# Patient Record
Sex: Female | Born: 1970 | Race: White | Hispanic: No | Marital: Married | Smoking: Never smoker
Health system: Southern US, Community
[De-identification: ages and names within clinical notes are randomized; demographics above are authoritative.]

## PROBLEM LIST (undated history)

## (undated) DIAGNOSIS — T7840XA Allergy, unspecified, initial encounter: Secondary | ICD-10-CM

## (undated) DIAGNOSIS — D649 Anemia, unspecified: Secondary | ICD-10-CM

## (undated) DIAGNOSIS — E039 Hypothyroidism, unspecified: Secondary | ICD-10-CM

## (undated) DIAGNOSIS — K625 Hemorrhage of anus and rectum: Secondary | ICD-10-CM

## (undated) DIAGNOSIS — Z8619 Personal history of other infectious and parasitic diseases: Secondary | ICD-10-CM

## (undated) DIAGNOSIS — I10 Essential (primary) hypertension: Secondary | ICD-10-CM

## (undated) DIAGNOSIS — K635 Polyp of colon: Secondary | ICD-10-CM

## (undated) DIAGNOSIS — I499 Cardiac arrhythmia, unspecified: Secondary | ICD-10-CM

## (undated) DIAGNOSIS — D259 Leiomyoma of uterus, unspecified: Secondary | ICD-10-CM

## (undated) DIAGNOSIS — K649 Unspecified hemorrhoids: Secondary | ICD-10-CM

## (undated) DIAGNOSIS — J45909 Unspecified asthma, uncomplicated: Secondary | ICD-10-CM

## (undated) DIAGNOSIS — G43909 Migraine, unspecified, not intractable, without status migrainosus: Secondary | ICD-10-CM

## (undated) DIAGNOSIS — K219 Gastro-esophageal reflux disease without esophagitis: Secondary | ICD-10-CM

## (undated) DIAGNOSIS — Z87898 Personal history of other specified conditions: Secondary | ICD-10-CM

## (undated) DIAGNOSIS — A6000 Herpesviral infection of urogenital system, unspecified: Secondary | ICD-10-CM

## (undated) DIAGNOSIS — R7309 Other abnormal glucose: Secondary | ICD-10-CM

## (undated) DIAGNOSIS — J309 Allergic rhinitis, unspecified: Secondary | ICD-10-CM

## (undated) DIAGNOSIS — K602 Anal fissure, unspecified: Secondary | ICD-10-CM

## (undated) HISTORY — DX: Leiomyoma of uterus, unspecified: D25.9

## (undated) HISTORY — DX: Herpesviral infection of urogenital system, unspecified: A60.00

## (undated) HISTORY — PX: DENTAL SURGERY: SHX609

## (undated) HISTORY — DX: Personal history of other infectious and parasitic diseases: Z86.19

## (undated) HISTORY — DX: Hypothyroidism, unspecified: E03.9

## (undated) HISTORY — DX: Allergy, unspecified, initial encounter: T78.40XA

## (undated) HISTORY — DX: Migraine, unspecified, not intractable, without status migrainosus: G43.909

## (undated) HISTORY — DX: Unspecified hemorrhoids: K64.9

## (undated) HISTORY — DX: Gastro-esophageal reflux disease without esophagitis: K21.9

## (undated) HISTORY — DX: Hemorrhage of anus and rectum: K62.5

## (undated) HISTORY — DX: Other abnormal glucose: R73.09

## (undated) HISTORY — DX: Allergic rhinitis, unspecified: J30.9

## (undated) HISTORY — DX: Polyp of colon: K63.5

## (undated) HISTORY — DX: Unspecified asthma, uncomplicated: J45.909

## (undated) HISTORY — DX: Anal fissure, unspecified: K60.2

## (undated) HISTORY — PX: ABDOMINAL HYSTERECTOMY: SHX81

---

## 2010-08-02 DIAGNOSIS — K219 Gastro-esophageal reflux disease without esophagitis: Secondary | ICD-10-CM

## 2010-08-02 HISTORY — DX: Gastro-esophageal reflux disease without esophagitis: K21.9

## 2013-08-02 DIAGNOSIS — K635 Polyp of colon: Secondary | ICD-10-CM

## 2013-08-02 HISTORY — PX: COLONOSCOPY: SHX174

## 2013-08-02 HISTORY — DX: Polyp of colon: K63.5

## 2015-10-30 ENCOUNTER — Encounter: Payer: Self-pay | Admitting: Family Medicine

## 2015-10-30 DIAGNOSIS — A6000 Herpesviral infection of urogenital system, unspecified: Secondary | ICD-10-CM | POA: Insufficient documentation

## 2015-10-30 DIAGNOSIS — E039 Hypothyroidism, unspecified: Secondary | ICD-10-CM | POA: Insufficient documentation

## 2015-11-03 ENCOUNTER — Encounter: Payer: Self-pay | Admitting: Family Medicine

## 2015-11-04 ENCOUNTER — Encounter: Payer: Self-pay | Admitting: Family Medicine

## 2015-11-04 ENCOUNTER — Ambulatory Visit (INDEPENDENT_AMBULATORY_CARE_PROVIDER_SITE_OTHER): Payer: Managed Care, Other (non HMO) | Admitting: Family Medicine

## 2015-11-04 VITALS — BP 143/97 | HR 82 | Temp 97.9°F | Resp 20 | Ht 70.0 in | Wt 191.8 lb

## 2015-11-04 DIAGNOSIS — Z7689 Persons encountering health services in other specified circumstances: Secondary | ICD-10-CM

## 2015-11-04 DIAGNOSIS — Z0000 Encounter for general adult medical examination without abnormal findings: Secondary | ICD-10-CM | POA: Insufficient documentation

## 2015-11-04 DIAGNOSIS — E038 Other specified hypothyroidism: Secondary | ICD-10-CM

## 2015-11-04 DIAGNOSIS — R7309 Other abnormal glucose: Secondary | ICD-10-CM | POA: Insufficient documentation

## 2015-11-04 DIAGNOSIS — Z7189 Other specified counseling: Secondary | ICD-10-CM

## 2015-11-04 DIAGNOSIS — R030 Elevated blood-pressure reading, without diagnosis of hypertension: Secondary | ICD-10-CM | POA: Diagnosis not present

## 2015-11-04 DIAGNOSIS — Z86010 Personal history of colonic polyps: Secondary | ICD-10-CM | POA: Insufficient documentation

## 2015-11-04 DIAGNOSIS — E034 Atrophy of thyroid (acquired): Secondary | ICD-10-CM

## 2015-11-04 DIAGNOSIS — IMO0001 Reserved for inherently not codable concepts without codable children: Secondary | ICD-10-CM

## 2015-11-04 DIAGNOSIS — Z860101 Personal history of adenomatous and serrated colon polyps: Secondary | ICD-10-CM

## 2015-11-04 NOTE — Progress Notes (Signed)
Patient ID: Ann Cain, female   DOB: August 13, 1970, 45 y.o.   MRN: [id number]      Patient ID: Ann Cain, female  DOB: 04-25-1971, 45 y.o.   MRN: [id number]  Subjective:  Ann Cain is a 45 y.o. female present for establishment of care. All past medical history, surgical history, allergies, family history, immunizations, medications and social history were obtained and updated in the electronic medical record today. Records from OB/GYN and gastroenterologist from [place] have reviewed and scanned into the system. Prior lab work reviewed and entered into the system. Patient has a significant medical history of precancerous polyps of the colon, hypothyroidism, elevated A1c, and elevated blood pressure. She moved here from [place] little over a year ago with her family, and has been able to get her children established with the pediatrician. And she is established with a gynecologist but has non-established with a primary care provider or a gastroenterologist in the area.   Health maintenance:  Colonoscopy: 03/02/2014; adenoma, 3 year follow-up due August 2018. Mammogram: 10/2014; normal, has completed at gynecologist. Normal per patient. Cervical cancer screening: 08/02/2013, hysterectomy, [facility] Immunizations: Unknown, awaiting records from PCP in [place].  Infectious disease screening: Unknown  DEXA:Not indicated  Last eye exam 2015  Prior labs outside source: 11/01/2014: Cholesterol total 173, triglycerides 100, HDL 43, LDL 110, 10/09/2014: TSH 3.480, T4 free 1.4, glucose 94, BUN 13, creatinine 0.84, GFR 87, BUN/creatinine ratio 15, sodium 141, potassium 4.6, chloride 101, carbon dioxide 23, calcium 9.4, protein 7.2, albumin 4.6, platelet 0.5, alkaline phosphatase 53, AST 14, ALT 13, hemoglobin A1c 5.9   Past Medical History  Diagnosis Date  . Genital HSV   . Hemorrhoid   . Hypothyroidism   . Anal fissure   . Allergic rhinitis     [place] allergy/asthma patient    . Colon polyp 2015    tubular adenoma  . Rectal bleeding   . Acid reflux 2012    egd  . Allergy   . Asthma   . Migraines   . History of chicken pox   . Fibroid uterus    No Known Allergies Past Surgical History  Procedure Laterality Date  . Cesarean section  2002, 2004  . Colonoscopy  2015    Dr. Claudie Leach internal hemorrhoids, 2 polyps  . Dental surgery    . Abdominal hysterectomy     Family History  Problem Relation Age of Onset  . Kidney cancer Father 52  . Lung cancer Maternal Grandfather   . Colon cancer Paternal Grandmother 15  . Heart disease Paternal Grandfather    Social History   Social History  . Marital Status: Married    Spouse Name: N/A  . Number of Children: N/A  . Years of Education: N/A   Occupational History  . Not on file.   Social History Main Topics  . Smoking status: Never Smoker   . Smokeless tobacco: Never Used  . Alcohol Use: No  . Drug Use: No  . Sexual Activity: Yes    Birth Control/ Protection: None   Other Topics Concern  . Not on file   Social History Narrative   Married to Ann Cain. 3 children (one set of twins). 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 Ann Cain, Ann Cain.   College education. Masters degree, stay-at-home mother.   No tobacco use, no recreational drugs, occasional alcohol.   Drinks caffeinated beverages, takes a daily vitamin   Wears her seatbelt and bicycle helmet   Smoke detector in the home  Feels safe in her relationship.    ROS: Negative, with the exception of above mentioned in HPI  Objective: BP 143/97 mmHg  Pulse 82  Temp(Src) 97.9 F (36.6 C) (Oral)  Resp 20  Ht 5\' 10"  (1.778 m)  Wt 191 lb 12.8 oz (87 kg)  BMI 27.52 kg/m2  SpO2 98% Gen: Afebrile. No acute distress. Nontoxic in appearance, well-developed, well-nourished, female, [place]. HENT: AT. [place]. . MMM, no oral lesions Eyes:Pupils Equal Round Reactive to light, Extraocular movements intact,  Conjunctiva without redness, discharge or  icterus. Neck/lymp/endocrine: Supple, no lymphadenopathy CV: RRR no murmur appreciated, no edema. Chest: CTAB, no wheeze, rhonchi or crackles. Abd: Soft. NTND. BS present.  Skin: No rashes, purpura or petechiae. Warm and well-perfused. Skin intact. Neuro/Msk: Normal gait. PERLA. EOMi. Alert. Oriented x3.   Psych: Normal affect, dress and demeanor. Normal speech. Normal thought content and judgment.  Assessment/plan: Ann Cain is a 45 y.o. female present for establishment of care.  1. Elevated hemoglobin A1c - Records indicate collected March 2016 with a 5.9 result. - Repeat A1c on yearly physical which will be within 4 weeks. - Encouraged diet and exercise modifications.  2. H/O adenomatous polyp of colon - Prior records reviewed, and scanned into the system. Precancerous adenoma and a hyperplastic polyp removed 2015. - 3 year follow-up, she will need a referral for this for a gastroenterologist in the area.  3. Elevated BP - BP mildly elevated on exam today. Patient states that she feels like her blood pressure is always elevated on exam. She does not routinely check it at home. She is encouraged to check it at home over the next few weeks, if blood pressure remains above 140/90 she needs to be seen to discuss her blood pressure. Otherwise we'll follow-up with her blood pressure recheck in one month at her physical. Encouraged low-salt diet, and exercise.  4. Hypothyroidism due to acquired atrophy of thyroid Patient has had thyroiditis and she was 13. She is on levothyroxine 100 g, currently. Last TSH collected in March 2016 and normal.  5. Healthcare maintenance: awaiting records from PCP in [place].  Greater than 45 minutes was spent with patient, greater than 50% of that time was spent face-to-face with patient counseling and coordinating care.   4 weeks for yearly physical and labs ( CBC, CMP, lipid panel, TSH, A1c)  Electronically signed by: Howard Pouch, DO [place]

## 2015-11-04 NOTE — Patient Instructions (Signed)
Check your BP at home or at [facility] and write them done if above 140/90. Lower salt diet and exercise > 150 minutes a week.  Follow up in 4-6 weeks with physical and we will do labs at that time. We will do fasting labs (cholesterol as well).

## 2015-11-06 ENCOUNTER — Ambulatory Visit: Payer: Self-pay | Admitting: Family Medicine

## 2015-11-18 ENCOUNTER — Telehealth: Payer: Self-pay | Admitting: Family Medicine

## 2015-11-18 MED ORDER — AZITHROMYCIN 250 MG PO TABS: ORAL_TABLET | ORAL | Status: DC

## 2015-11-18 NOTE — Telephone Encounter (Signed)
Patient's children have been to pediatrician for whopping cough. There have been confirmed cases at [facility]. They will be going on a Zpac. The pediatrician recommended that patient go on a Zpac too as that she is also displaying they same symptoms as her children.

## 2015-11-18 NOTE — Telephone Encounter (Signed)
Pt was likely immunized against this illness during her last pregnancy.  - However I will call in a zpack. If symptoms worsen she will need to be seen. Cough can last >4 weeks with whooping cough. Patient does not have pharmacy listed, prescription printed and signed.

## 2015-11-19 NOTE — Telephone Encounter (Signed)
Spoke with patient she will come in to pick up script.

## 2015-11-21 ENCOUNTER — Encounter: Payer: Self-pay | Admitting: Family Medicine

## 2015-11-24 ENCOUNTER — Encounter: Payer: Self-pay | Admitting: Family Medicine

## 2015-12-08 ENCOUNTER — Ambulatory Visit (INDEPENDENT_AMBULATORY_CARE_PROVIDER_SITE_OTHER): Payer: Managed Care, Other (non HMO) | Admitting: Family Medicine

## 2015-12-08 ENCOUNTER — Encounter: Payer: Self-pay | Admitting: Family Medicine

## 2015-12-08 VITALS — BP 152/98 | HR 72 | Temp 98.9°F | Resp 20 | Ht 70.0 in | Wt 188.2 lb

## 2015-12-08 DIAGNOSIS — E038 Other specified hypothyroidism: Secondary | ICD-10-CM | POA: Diagnosis not present

## 2015-12-08 DIAGNOSIS — I10 Essential (primary) hypertension: Secondary | ICD-10-CM | POA: Diagnosis not present

## 2015-12-08 DIAGNOSIS — R7309 Other abnormal glucose: Secondary | ICD-10-CM | POA: Diagnosis not present

## 2015-12-08 DIAGNOSIS — E034 Atrophy of thyroid (acquired): Secondary | ICD-10-CM

## 2015-12-08 MED ORDER — HYDROCHLOROTHIAZIDE 25 MG PO TABS: 25.0000 mg | ORAL_TABLET | Freq: Every day | ORAL | Status: DC

## 2015-12-08 NOTE — Patient Instructions (Signed)
HCTZ 25  Mg start today nurse visit 1 week with fasting labs and then CPE to follow a few days later. Low-Sodium Eating Plan Sodium raises blood pressure and causes water to be held in the body. Getting less sodium from food will help lower your blood pressure, reduce any swelling, and protect your heart, liver, and kidneys. We get sodium by adding salt (sodium chloride) to food. Most of our sodium comes from canned, boxed, and frozen foods. Restaurant foods, fast foods, and pizza are also very high in sodium. Even if you take medicine to lower your blood pressure or to reduce fluid in your body, getting less sodium from your food is important. WHAT IS MY PLAN? Most people should limit their sodium intake to 2,300 mg a day. Your health care provider recommends that you limit your sodium intake to __________ a day.  WHAT DO I NEED TO KNOW ABOUT THIS EATING PLAN? For the low-sodium eating plan, you will follow these general guidelines:  Choose foods with a % Daily Value for sodium of less than 5% (as listed on the food label).   Use salt-free seasonings or herbs instead of table salt or sea salt.   Check with your health care provider or pharmacist before using salt substitutes.   Eat fresh foods.  Eat more vegetables and fruits.  Limit canned vegetables. If you do use them, rinse them well to decrease the sodium.   Limit cheese to 1 oz (28 g) per day.   Eat lower-sodium products, often labeled as "lower sodium" or "no salt added."  Avoid foods that contain monosodium glutamate (MSG). MSG is sometimes added to [place] food and some canned foods.  Check food labels (Nutrition Facts labels) on foods to learn how much sodium is in one serving.  Eat more home-cooked food and less restaurant, buffet, and fast food.  When eating at a restaurant, ask that your food be prepared with less salt, or no salt if possible.  HOW DO I READ FOOD LABELS FOR SODIUM INFORMATION? The Nutrition  Facts label lists the amount of sodium in one serving of the food. If you eat more than one serving, you must multiply the listed amount of sodium by the number of servings. Food labels may also identify foods as:  Sodium free--Less than 5 mg in a serving.  Very low sodium--35 mg or less in a serving.  Low sodium--140 mg or less in a serving.  Light in sodium--50% less sodium in a serving. For example, if a food that usually has 300 mg of sodium is changed to become light in sodium, it will have 150 mg of sodium.  Reduced sodium--25% less sodium in a serving. For example, if a food that usually has 400 mg of sodium is changed to reduced sodium, it will have 300 mg of sodium. WHAT FOODS CAN I EAT? Grains Low-sodium cereals, including oats, puffed wheat and rice, and shredded wheat cereals. Low-sodium crackers. Unsalted rice and pasta. Lower-sodium bread.  Vegetables Frozen or fresh vegetables. Low-sodium or reduced-sodium canned vegetables. Low-sodium or reduced-sodium tomato sauce and paste. Low-sodium or reduced-sodium tomato and vegetable juices.  Fruits Fresh, frozen, and canned fruit. Fruit juice.  Meat and Other Protein Products Low-sodium canned tuna and salmon. Fresh or frozen meat, poultry, seafood, and fish. Lamb. Unsalted nuts. Dried beans, peas, and lentils without added salt. Unsalted canned beans. Homemade soups without salt. Eggs.  Dairy Milk. Soy milk. Ricotta cheese. Low-sodium or reduced-sodium cheeses. Yogurt.  Condiments Fresh  and dried herbs and spices. Salt-free seasonings. Onion and garlic powders. Low-sodium varieties of mustard and ketchup. Fresh or refrigerated horseradish. Lemon juice.  Fats and Oils Reduced-sodium salad dressings. Unsalted butter.  Other Unsalted popcorn and pretzels.  The items listed above may not be a complete list of recommended foods or beverages. Contact your dietitian for more options. WHAT FOODS ARE NOT  RECOMMENDED? Grains Instant hot cereals. Bread stuffing, pancake, and biscuit mixes. Croutons. Seasoned rice or pasta mixes. Noodle soup cups. Boxed or frozen macaroni and cheese. Self-rising flour. Regular salted crackers. Vegetables Regular canned vegetables. Regular canned tomato sauce and paste. Regular tomato and vegetable juices. Frozen vegetables in sauces. Salted [place] fries. Olives. Angie Fava. Relishes. Sauerkraut. Salsa. Meat and Other Protein Products Salted, canned, smoked, spiced, or pickled meats, seafood, or fish. Bacon, ham, sausage, hot dogs, corned beef, chipped beef, and packaged luncheon meats. Salt pork. Jerky. Pickled herring. Anchovies, regular canned tuna, and sardines. Salted nuts. Dairy Processed cheese and cheese spreads. Cheese curds. Blue cheese and cottage cheese. Buttermilk.  Condiments Onion and garlic salt, seasoned salt, table salt, and sea salt. Canned and packaged gravies. Worcestershire sauce. Tartar sauce. Barbecue sauce. Teriyaki sauce. Soy sauce, including reduced sodium. Steak sauce. Fish sauce. Oyster sauce. Cocktail sauce. Horseradish that you find on the shelf. Regular ketchup and mustard. Meat flavorings and tenderizers. Bouillon cubes. Hot sauce. Tabasco sauce. Marinades. Taco seasonings. Relishes. Fats and Oils Regular salad dressings. Salted butter. Margarine. Ghee. Bacon fat.  Other Potato and tortilla chips. Corn chips and puffs. Salted popcorn and pretzels. Canned or dried soups. Pizza. Frozen entrees and pot pies.  The items listed above may not be a complete list of foods and beverages to avoid. Contact your dietitian for more information.   This information is not intended to replace advice given to you by your health care provider. Make sure you discuss any questions you have with your health care provider.   Document Released: 01/08/2002 Document Revised: 08/09/2014 Document Reviewed: 05/23/2013 Elsevier Interactive Patient Education  2016 [employer].   Hypertension Hypertension, commonly called high blood pressure, is when the force of blood pumping through your arteries is too strong. Your arteries are the blood vessels that carry blood from your heart throughout your body. A blood pressure reading consists of a higher number over a lower number, such as 110/72. The higher number (systolic) is the pressure inside your arteries when your heart pumps. The lower number (diastolic) is the pressure inside your arteries when your heart relaxes. Ideally you want your blood pressure below 120/80. Hypertension forces your heart to work harder to pump blood. Your arteries may become narrow or stiff. Having untreated or uncontrolled hypertension can cause heart attack, stroke, kidney disease, and other problems. RISK FACTORS Some risk factors for high blood pressure are controllable. Others are not.  Risk factors you cannot control include:   Race. You may be at higher risk if you are African American.  Age. Risk increases with age.  Gender. Men are at higher risk than women before age 38 years. After age 58, women are at higher risk than men. Risk factors you can control include:  Not getting enough exercise or physical activity.  Being overweight.  Getting too much fat, sugar, calories, or salt in your diet.  Drinking too much alcohol. SIGNS AND SYMPTOMS Hypertension does not usually cause signs or symptoms. Extremely high blood pressure (hypertensive crisis) may cause headache, anxiety, shortness of breath, and nosebleed. DIAGNOSIS To check if you  have hypertension, your health care provider will measure your blood pressure while you are seated, with your arm held at the level of your heart. It should be measured at least twice using the same arm. Certain conditions can cause a difference in blood pressure between your right and left arms. A blood pressure reading that is higher than normal on one occasion does not mean  that you need treatment. If it is not clear whether you have high blood pressure, you may be asked to return on a different day to have your blood pressure checked again. Or, you may be asked to monitor your blood pressure at home for 1 or more weeks. TREATMENT Treating high blood pressure includes making lifestyle changes and possibly taking medicine. Living a healthy lifestyle can help lower high blood pressure. You may need to change some of your habits. Lifestyle changes may include:  Following the DASH diet. This diet is high in fruits, vegetables, and whole grains. It is low in salt, red meat, and added sugars.  Keep your sodium intake below 2,300 mg per day.  Getting at least 30-45 minutes of aerobic exercise at least 4 times per week.  Losing weight if necessary.  Not smoking.  Limiting alcoholic beverages.  Learning ways to reduce stress. Your health care provider may prescribe medicine if lifestyle changes are not enough to get your blood pressure under control, and if one of the following is true:  You are 78-13 years of age and your systolic blood pressure is above 140.  You are 4 years of age or older, and your systolic blood pressure is above 150.  Your diastolic blood pressure is above 90.  You have diabetes, and your systolic blood pressure is over [id number] or your diastolic blood pressure is over 90.  You have kidney disease and your blood pressure is above 140/90.  You have heart disease and your blood pressure is above 140/90. Your personal target blood pressure may vary depending on your medical conditions, your age, and other factors. HOME CARE INSTRUCTIONS  Have your blood pressure rechecked as directed by your health care provider.   Take medicines only as directed by your health care provider. Follow the directions carefully. Blood pressure medicines must be taken as prescribed. The medicine does not work as well when you skip doses. Skipping doses also puts  you at risk for problems.  Do not smoke.   Monitor your blood pressure at home as directed by your health care provider. SEEK MEDICAL CARE IF:   You think you are having a reaction to medicines taken.  You have recurrent headaches or feel dizzy.  You have swelling in your ankles.  You have trouble with your vision. SEEK IMMEDIATE MEDICAL CARE IF:  You develop a severe headache or confusion.  You have unusual weakness, numbness, or feel faint.  You have severe chest or abdominal pain.  You vomit repeatedly.  You have trouble breathing. MAKE SURE YOU:   Understand these instructions.  Will watch your condition.  Will get help right away if you are not doing well or get worse.   This information is not intended to replace advice given to you by your health care provider. Make sure you discuss any questions you have with your health care provider.   Document Released: 07/19/2005 Document Revised: 12/03/2014 Document Reviewed: 05/11/2013 Elsevier Interactive Patient Education 2016 [place] your blood pressure at home, it should be below 140/90.

## 2015-12-08 NOTE — Progress Notes (Signed)
Patient ID: Ann Cain, female   DOB: Mar 23, 1971, 45 y.o.   MRN: [id number]    Ann Cain , 07-02-1971, 45 y.o., female MRN: [id number]  CC: Hypertension Subjective: Pt presents for an acute OV with high blood pressure readings. She was seen for establishment of few weeks ago with a elevated blood pressure at that time. She dates she feels like her blood pressure had always been borderline.  She does not feel that she consumes much salt in her diet, but she does not watch her diet closely. She denies any chest pain, shortness of breath or lower extremity edema. She does admit that she has swelling in her hands quite frequently.  She has a family history of heart disease, and states that she thinks one of her grandparents was on a hypertension medication. He does not routinely exercise.  No Known Allergies Social History  Substance Use Topics  . Smoking status: Never Smoker   . Smokeless tobacco: Never Used  . Alcohol Use: No   Past Medical History  Diagnosis Date  . Genital HSV   . Hemorrhoid   . Hypothyroidism   . Anal fissure   . Allergic rhinitis     [place] allergy/asthma patient  . Colon polyp 2015    tubular adenoma  . Rectal bleeding   . Acid reflux 2012    egd  . Allergy   . Asthma   . Migraines   . History of chicken pox   . Fibroid uterus    Past Surgical History  Procedure Laterality Date  . Cesarean section  2002, 2004  . Colonoscopy  2015    Dr. Claudie Leach internal hemorrhoids, 2 polyps  . Dental surgery    . Abdominal hysterectomy     Family History  Problem Relation Age of Onset  . Kidney cancer Father 85  . Lung cancer Maternal Grandfather   . Colon cancer Paternal Grandmother 10  . Heart disease Paternal Grandfather      Medication List       This list is accurate as of: 12/08/15  9:57 AM.  Always use your most recent med list.               fexofenadine 60 MG tablet  Commonly known as:  ALLEGRA  Take 60 mg by mouth 2 (two) times daily.     levothyroxine 100 MCG tablet  Commonly known as:  SYNTHROID, LEVOTHROID     NONFORMULARY OR COMPOUNDED ITEM  Diltiazem HCL 2%     PROAIR RESPICLICK 123XX123 (90 Base) MCG/ACT Aepb  Generic drug:  Albuterol Sulfate     tretinoin 0.025 % cream  Commonly known as:  RETIN-A  APPLY 1 APPLICATION A PEARL-SIZED AMOUNT TO FACE IN THE EVENING ONCE A DAY EXTERNALLY 30 DAYS     valACYclovir 500 MG tablet  Commonly known as:  VALTREX  TAKE 1 TABLET BY MOUTH DAILY AS PREVENTION, INCREASE TO TWICE A DAY FOR 3 DAYS WITH LESIONS       Social History   Social History Narrative   Married to Ann Cain. 3 children (one set of twins). 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 [name], [name].   College education. Masters degree, stay-at-home mother.   No tobacco use, no recreational drugs, occasional alcohol.   Drinks caffeinated beverages, takes a daily vitamin   Wears her seatbelt and bicycle helmet   Smoke detector in the home    Feels safe in her relationship.     ROS: Negative, with the exception of above  mentioned in HPI   Objective:  BP 141/102 mmHg  Pulse 72  Temp(Src) 98.9 F (37.2 C) (Oral)  Resp 20  Ht 5\' 10"  (1.778 m)  Wt 188 lb 4 oz (85.39 kg)  BMI 27.01 kg/m2  SpO2 96% Body mass index is 27.01 kg/(m^2). Gen: Afebrile. No acute distress. Nontoxic in appearance, well-developed, well-nourished, Caucasian female. HENT: AT. [place].  MMM, no oral lesions. Eyes:Pupils Equal Round Reactive to light, Extraocular movements intact,  Conjunctiva without redness, discharge or icterus. CV: RRR , no murmur appreciated. No lower extremity edema. Chest: CTAB, no wheeze or crackles. Good air movement, normal resp effort.  Neuro: Normal gait. PERLA. EOMi. Alert. Oriented x3  Psych: Normal affect, dress and demeanor. Normal speech. Normal thought content and judgment.  Assessment/Plan: Ann Cain is a 45 y.o. female present for acute OV for  1. Essential hypertension - New problem - Patient now with 2  elevated blood pressure readings during office visit, despite repeat testing. She did not follow up with testing in the outpatient setting. Discussed with her today we need to get her blood pressure under better control. She is to monitor the salt content in her diet, monitor her blood pressure in the outpatient setting. - Start HCTZ 25 mg daily today. Follow-up with the nurse visit in 1 week for blood pressure recheck. - hydrochlorothiazide (HYDRODIURIL) 25 MG tablet; Take 1 tablet (25 mg total) by mouth daily.  Dispense: 30 tablet; Refill: 0 - Future labs have been placed for her to have them drawn during her nurse visit next week, and then follow-up with her complete physical within a week after her lab draw.   electronically signed by:  Howard Pouch, DO  [place]

## 2015-12-15 ENCOUNTER — Other Ambulatory Visit: Payer: Managed Care, Other (non HMO)

## 2015-12-15 ENCOUNTER — Ambulatory Visit (INDEPENDENT_AMBULATORY_CARE_PROVIDER_SITE_OTHER): Payer: Managed Care, Other (non HMO) | Admitting: Family Medicine

## 2015-12-15 DIAGNOSIS — R7309 Other abnormal glucose: Secondary | ICD-10-CM | POA: Diagnosis not present

## 2015-12-15 DIAGNOSIS — E034 Atrophy of thyroid (acquired): Secondary | ICD-10-CM | POA: Diagnosis not present

## 2015-12-15 DIAGNOSIS — I10 Essential (primary) hypertension: Secondary | ICD-10-CM | POA: Diagnosis not present

## 2015-12-15 DIAGNOSIS — E038 Other specified hypothyroidism: Secondary | ICD-10-CM | POA: Diagnosis not present

## 2015-12-15 LAB — COMPREHENSIVE METABOLIC PANEL
ALK PHOS: 56 U/L (ref 39–117)
ALT: 29 U/L (ref 0–35)
AST: 17 U/L (ref 0–37)
Albumin: 4.9 g/dL (ref 3.5–5.2)
BUN: 17 mg/dL (ref 6–23)
CHLORIDE: 98 meq/L (ref 96–112)
CO2: 32 mEq/L (ref 19–32)
Calcium: 10.2 mg/dL (ref 8.4–10.5)
Creatinine, Ser: 0.96 mg/dL (ref 0.40–1.20)
GFR: 67.38 mL/min (ref >60.00)
GLUCOSE: 102 mg/dL — AB (ref 70–99)
POTASSIUM: 3.7 meq/L (ref 3.5–5.1)
SODIUM: 138 meq/L (ref 135–145)
Total Bilirubin: 0.9 mg/dL (ref 0.2–1.2)
Total Protein: 7.6 g/dL (ref 6.0–8.3)

## 2015-12-15 LAB — LIPID PANEL
Cholesterol: 231 mg/dL — ABNORMAL HIGH (ref 0–200)
HDL: 42.4 mg/dL (ref >39.00)
LDL Cholesterol: 163 mg/dL — ABNORMAL HIGH (ref 0–99)
NONHDL: 188.65
TRIGLYCERIDES: 126 mg/dL (ref 0.0–149.0)
Total CHOL/HDL Ratio: 5
VLDL: 25.2 mg/dL (ref 0.0–40.0)

## 2015-12-15 LAB — CBC WITH DIFFERENTIAL/PLATELET
BASOS PCT: 0.6 % (ref 0.0–3.0)
Basophils Absolute: 0 10*3/uL (ref 0.0–0.1)
Eosinophils Absolute: 0.1 10*3/uL (ref 0.0–0.7)
Eosinophils Relative: 2.1 % (ref 0.0–5.0)
HCT: 47.4 % — ABNORMAL HIGH (ref 36.0–46.0)
Hemoglobin: 15.9 g/dL — ABNORMAL HIGH (ref 12.0–15.0)
LYMPHS ABS: 1.2 10*3/uL (ref 0.7–4.0)
Lymphocytes Relative: 18.8 % (ref 12.0–46.0)
MCHC: 33.6 g/dL (ref 30.0–36.0)
MCV: 90.8 fl (ref 78.0–100.0)
MONO ABS: 0.8 10*3/uL (ref 0.1–1.0)
Monocytes Relative: 12.7 % — ABNORMAL HIGH (ref 3.0–12.0)
NEUTROS ABS: 4.1 10*3/uL (ref 1.4–7.7)
NEUTROS PCT: 65.8 % (ref 43.0–77.0)
Platelets: 240 10*3/uL (ref 150.0–400.0)
RBC: 5.22 Mil/uL — ABNORMAL HIGH (ref 3.87–5.11)
RDW: 13.1 % (ref 11.5–15.5)
WBC: 6.2 10*3/uL (ref 4.0–10.5)

## 2015-12-15 LAB — TSH: TSH: 1.75 u[IU]/mL (ref 0.35–4.50)

## 2015-12-15 LAB — HEMOGLOBIN A1C: HEMOGLOBIN A1C: 5.9 % (ref 4.6–6.5)

## 2015-12-15 NOTE — Progress Notes (Addendum)
Patient had appointment today for nurse visit only for BP check and lab draw.  Patient first BP check was elevated at 153/115.  I am going to let patient sit in a quiet room for 10 minutes and recheck BP and heart rate.   After sitting ten minutes, patients BP was still elevated at 149/113 with a resting pulse of 96.  Dr. Raoul Pitch advised.  Patient was given instructions to take HCTZ 25 MG BID per Dr Raoul Pitch new orders and to keep scheduled follow up in one week.  Patient agreed.  BP 153/115 mmHg  Pulse 98    Electronically Signed by: Howard Pouch, DO [facility] [place]

## 2015-12-15 NOTE — Addendum Note (Signed)
Addended by: Howard Pouch A on: 12/15/2015 05:12 PM   Modules accepted: Level of Service

## 2015-12-16 ENCOUNTER — Telehealth: Payer: Self-pay | Admitting: Family Medicine

## 2015-12-16 NOTE — Telephone Encounter (Signed)
Spoke with Dr Raoul Pitch patient needs to keep her appt as instructed at her appt yesterday. Left message with information on patient voice mail.

## 2015-12-16 NOTE — Telephone Encounter (Signed)
Patient states she is scheduled for complete physical on Monday, 5/22.  However, due to her current health issues she wants to know if she still needs to come in for her cpe as scheduled.  Patient wants pcp to know she thinks the symptoms she is experiencing is related to her gall bladder.  Patient requesting call back to advise what pcp recommends.

## 2015-12-17 ENCOUNTER — Encounter: Payer: Self-pay | Admitting: Family Medicine

## 2015-12-17 ENCOUNTER — Ambulatory Visit (INDEPENDENT_AMBULATORY_CARE_PROVIDER_SITE_OTHER): Payer: Managed Care, Other (non HMO) | Admitting: Family Medicine

## 2015-12-17 VITALS — BP 160/100 | HR 109 | Temp 98.3°F | Resp 20 | Wt 179.5 lb

## 2015-12-17 DIAGNOSIS — R1011 Right upper quadrant pain: Secondary | ICD-10-CM | POA: Diagnosis not present

## 2015-12-17 LAB — LIPASE: LIPASE: 37 U/L (ref 11.0–59.0)

## 2015-12-17 LAB — H. PYLORI ANTIBODY, IGG: H Pylori IgG: NEGATIVE

## 2015-12-17 MED ORDER — OMEPRAZOLE 40 MG PO CPDR: 40.0000 mg | DELAYED_RELEASE_CAPSULE | Freq: Every day | ORAL | Status: DC

## 2015-12-17 MED ORDER — ALIGN 4 MG PO CAPS: 1.0000 | ORAL_CAPSULE | Freq: Every day | ORAL | Status: DC

## 2015-12-17 NOTE — Progress Notes (Addendum)
Patient ID: Ann Cain, female   DOB: 1970/11/14, 45 y.o.   MRN: [id number]    Mikelle Myrick , 1970/12/01, 45 y.o., female MRN: [id number]  CC: abd pain  Subjective:  Abdominal pain: patient presents for abdominal pain that has worsened over the last 2 weeks after eating dinner out, at a restaurant with her husband. Her husband also ate the same meal, and is not ill. She states she ate filet, lobster and some type of custard. She does not usually eat foods like that and felt immediatly that evening noticed belching, gas, fever, chills and diarrhea x2 . She states the RUQ pain has been present off and on for many years, and has had prior US that was normal. She feels over the last week this is worse with nausea without vomit and decreased appetite. Patient states the symptoms are "difficult to explain" because so many things are "coming and going". She states symptoms are worse after attempts at meals. She feels the heartburn is also getting worse. She endorses multiple other compliant including nail changes, elevated BP despite start of therapy, palpitations, insomnia, dry  Mouth (after HCTZ start). Patient has a h/o GERD/gastritis by prior EGD a few years ago. She has not routinely taken PPI. Pt states she has noticed bowel changes in her entire family after moving to [place]. They do have well water. She has a h/o thyroid disorder, with NL panel this week. She denies increased anxiety and states her sisters both have anxiety and needs medications, but she does not want to take meds. She reports the yoga and tai chi classes were helping, but her husband did not "want her taking classes from a female instructor." She states with the insomnia she is just "unable to relax". While in the car driving she has to just train her self to take deep cleansing breaths.   GAD 7 : Generalized Anxiety Score 12/17/2015  Nervous, Anxious, on Edge 2  Control/stop worrying 0  Worry too much - different things 0  Trouble  relaxing 1  Restless 0  Easily annoyed or irritable 1  Afraid - awful might happen 1  Total GAD 7 Score 5     Depression screen PHQ 2/9 12/17/2015  Decreased Interest 0  Down, Depressed, Hopeless 0  PHQ - 2 Score 0    Mood disorder screen: Negative  No Known Allergies Social History  Substance Use Topics  . Smoking status: Never Smoker   . Smokeless tobacco: Never Used  . Alcohol Use: No   Past Medical History  Diagnosis Date  . Genital HSV   . Hemorrhoid   . Hypothyroidism   . Anal fissure   . Allergic rhinitis     [facility] allergy/asthma patient  . Colon polyp 2015    tubular adenoma  . Rectal bleeding   . Acid reflux 2012    egd  . Allergy   . Asthma   . Migraines   . History of chicken pox   . Fibroid uterus    Past Surgical History  Procedure Laterality Date  . Cesarean section  2002, 2004  . Colonoscopy  2015    Dr. Claudie Leach internal hemorrhoids, 2 polyps  . Dental surgery    . Abdominal hysterectomy     Family History  Problem Relation Age of Onset  . Kidney cancer Father 78  . Lung cancer Maternal Grandfather   . Colon cancer Paternal Grandmother 40  . Heart disease Paternal Grandmother   . Heart disease Paternal  Grandfather      Medication List       This list is accurate as of: 12/17/15 11:02 AM.  Always use your most recent med list.               fexofenadine 60 MG tablet  Commonly known as:  ALLEGRA  Take 60 mg by mouth 2 (two) times daily.     hydrochlorothiazide 25 MG tablet  Commonly known as:  HYDRODIURIL  Take 1 tablet (25 mg total) by mouth daily.     hydrocortisone 25 MG suppository  Commonly known as:  ANUSOL-HC  INSERT 1 (ONE) SUPPOSITORY, RECTAL, TWO TIMES DAILY     levothyroxine 100 MCG tablet  Commonly known as:  SYNTHROID, LEVOTHROID     NONFORMULARY OR COMPOUNDED ITEM  Diltiazem HCL 2%     PROAIR RESPICLICK 656 (90 Base) MCG/ACT Aepb  Generic drug:  Albuterol Sulfate     tretinoin 0.025 % cream  Commonly  known as:  RETIN-A  APPLY 1 APPLICATION A PEARL-SIZED AMOUNT TO FACE IN THE EVENING ONCE A DAY EXTERNALLY 30 DAYS     valACYclovir 500 MG tablet  Commonly known as:  VALTREX  TAKE 1 TABLET BY MOUTH DAILY AS PREVENTION, INCREASE TO TWICE A DAY FOR 3 DAYS WITH LESIONS       Social History   Social History  . Marital Status: Married    Spouse Name: N/A  . Number of Children: N/A  . Years of Education: N/A   Occupational History  . Not on file.   Social History Main Topics  . Smoking status: Never Smoker   . Smokeless tobacco: Never Used  . Alcohol Use: No  . Drug Use: No  . Sexual Activity: Yes    Birth Control/ Protection: None   Other Topics Concern  . Not on file   Social History Narrative   Married to [place]. 3 children (one set of twins). 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 [name], [name].   College education. Masters degree, stay-at-home mother.   No tobacco use, no recreational drugs, occasional alcohol.   Drinks caffeinated beverages, takes a daily vitamin   Wears her seatbelt and bicycle helmet   Smoke detector in the home    Feels safe in her relationship.    ROS: Negative, with the exception of above mentioned in HPI  Objective:  BP 160/100 mmHg  Pulse 109  Temp(Src) 98.3 F (36.8 C) (Oral)  Resp 20  Wt 179 lb 8 oz (81.421 kg)  SpO2 100% Body mass index is 25.76 kg/(m^2). Gen: Afebrile. Pacing the floor in the room. Well developed, well nourished, caucasian, female.  HENT: AT. [place].  MMM, no oral lesions.  Eyes:Pupils Equal Round Reactive to light, Extraocular movements intact,  Conjunctiva without redness, discharge or icterus. Neck/lymp/endocrine: Supple,No  lymphadenopathy, no  thyromegaly CV: tachycardic, regular rhythm. No murmur.   Chest: CTAB, no wheeze or crackles.  Abd: Soft. Flat . ND. RUQ and epigastric TTP. +murphy's sign. BS present. No Masses palpated. No rebound or guarding.  Skin: No rashes, purpura or petechiae.  Neuro: Normal gait. PERLA.  EOMi. Alert. Oriented x3  Psych: Normal affect, dress and demeanor. Normal speech/rambles. Normal thought content and judgment..   Assessment/Plan: Etter Royall is a 45 y.o. female present for acute OV for  Abdominal pain, right upper quadrant/epigatric: - H. pylori antibody, IgG - Lipase - US Abdomen Complete; Future - Probiotic Product (ALIGN) 4 MG CAPS; Take 1 capsule (4 mg total) by mouth daily.  Dispense: 30 capsule; Refill: 1 - omeprazole (PRILOSEC) 40 MG capsule; Take 1 capsule (40 mg total) by mouth daily.  Dispense: 30 capsule; Refill: 3 - have well water checked - GERD diet - F/U 4 weeks, or sooner if abnormal studies/imagining or symptoms worsen.    > 25 minutes spent with patient, >50% of time spent face to face counseling patient and coordinating care.  electronically signed by:  Howard Pouch, DO  [place]

## 2015-12-17 NOTE — Patient Instructions (Signed)
Food Choices for Gastroesophageal Reflux Disease, Adult When you have gastroesophageal reflux disease (GERD), the foods you eat and your eating habits are very important. Choosing the right foods can help ease the discomfort of GERD. WHAT GENERAL GUIDELINES DO I NEED TO FOLLOW?  Choose fruits, vegetables, whole grains, low-fat dairy products, and low-fat meat, fish, and poultry.  Limit fats such as oils, salad dressings, butter, nuts, and avocado.  Keep a food diary to identify foods that cause symptoms.  Avoid foods that cause reflux. These may be different for different people.  Eat frequent small meals instead of three large meals each day.  Eat your meals slowly, in a relaxed setting.  Limit fried foods.  Cook foods using methods other than frying.  Avoid drinking alcohol.  Avoid drinking large amounts of liquids with your meals.  Avoid bending over or lying down until 2-3 hours after eating. WHAT FOODS ARE NOT RECOMMENDED? The following are some foods and drinks that may worsen your symptoms: Vegetables Tomatoes. Tomato juice. Tomato and spaghetti sauce. Chili peppers. Onion and garlic. Horseradish. Fruits Oranges, grapefruit, and lemon (fruit and juice). Meats High-fat meats, fish, and poultry. This includes hot dogs, ribs, ham, sausage, salami, and bacon. Dairy Whole milk and chocolate milk. Sour cream. Cream. Butter. Ice cream. Cream cheese.  Beverages Coffee and tea, with or without caffeine. Carbonated beverages or energy drinks. Condiments Hot sauce. Barbecue sauce.  Sweets/Desserts Chocolate and cocoa. Donuts. Peppermint and spearmint. Fats and Oils High-fat foods, including [place] fries and potato chips. Other Vinegar. Strong spices, such as black pepper, white pepper, red pepper, cayenne, curry powder, cloves, ginger, and chili powder. The items listed above may not be a complete list of foods and beverages to avoid. Contact your dietitian for more  information.   This information is not intended to replace advice given to you by your health care provider. Make sure you discuss any questions you have with your health care provider.   Document Released: 07/19/2005 Document Revised: 08/09/2014 Document Reviewed: 05/23/2013 Elsevier Interactive Patient Education 2016 [employer].  Gastroesophageal Reflux Disease, Adult Normally, food travels down the esophagus and stays in the stomach to be digested. However, when a person has gastroesophageal reflux disease (GERD), food and stomach acid move back up into the esophagus. When this happens, the esophagus becomes sore and inflamed. Over time, GERD can create small holes (ulcers) in the lining of the esophagus.  CAUSES This condition is caused by a problem with the muscle between the esophagus and the stomach (lower esophageal sphincter, or LES). Normally, the LES muscle closes after food passes through the esophagus to the stomach. When the LES is weakened or abnormal, it does not close properly, and that allows food and stomach acid to go back up into the esophagus. The LES can be weakened by certain dietary substances, medicines, and medical conditions, including:  Tobacco use.  Pregnancy.  Having a hiatal hernia.  Heavy alcohol use.  Certain foods and beverages, such as coffee, chocolate, onions, and peppermint. RISK FACTORS This condition is more likely to develop in:  People who have an increased body weight.  People who have connective tissue disorders.  People who use NSAID medicines. SYMPTOMS Symptoms of this condition include:  Heartburn.  Difficult or painful swallowing.  The feeling of having a lump in the throat.  Abitter taste in the mouth.  Bad breath.  Having a large amount of saliva.  Having an upset or bloated stomach.  Belching.  Chest pain.  Shortness of breath or wheezing.  Ongoing (chronic) cough or a night-time cough.  Wearing away of  tooth enamel.  Weight loss. Different conditions can cause chest pain. Make sure to see your health care provider if you experience chest pain. DIAGNOSIS Your health care provider will take a medical history and perform a physical exam. To determine if you have mild or severe GERD, your health care provider may also monitor how you respond to treatment. You may also have other tests, including:  An endoscopy toexamine your stomach and esophagus with a small camera.  A test thatmeasures the acidity level in your esophagus.  A test thatmeasures how much pressure is on your esophagus.  A barium swallow or modified barium swallow to show the shape, size, and functioning of your esophagus. TREATMENT The goal of treatment is to help relieve your symptoms and to prevent complications. Treatment for this condition may vary depending on how severe your symptoms are. Your health care provider may recommend:  Changes to your diet.  Medicine.  Surgery. HOME CARE INSTRUCTIONS Diet  Follow a diet as recommended by your health care provider. This may involve avoiding foods and drinks such as:  Coffee and tea (with or without caffeine).  Drinks that containalcohol.  Energy drinks and sports drinks.  Carbonated drinks or sodas.  Chocolate and cocoa.  Peppermint and mint flavorings.  Garlic and onions.  Horseradish.  Spicy and acidic foods, including peppers, chili powder, curry powder, vinegar, hot sauces, and barbecue sauce.  Citrus fruit juices and citrus fruits, such as oranges, lemons, and limes.  Tomato-based foods, such as red sauce, chili, salsa, and pizza with red sauce.  Fried and fatty foods, such as donuts, french fries, potato chips, and high-fat dressings.  High-fat meats, such as hot dogs and fatty cuts of red and white meats, such as rib eye steak, sausage, ham, and bacon.  High-fat dairy items, such as whole milk, butter, and cream cheese.  Eat small,  frequent meals instead of large meals.  Avoid drinking large amounts of liquid with your meals.  Avoid eating meals during the 2-3 hours before bedtime.  Avoid lying down right after you eat.  Do not exercise right after you eat. General Instructions  Pay attention to any changes in your symptoms.  Take over-the-counter and prescription medicines only as told by your health care provider. Do not take aspirin, ibuprofen, or other NSAIDs unless your health care provider told you to do so.  Do not use any tobacco products, including cigarettes, chewing tobacco, and e-cigarettes. If you need help quitting, ask your health care provider.  Wear loose-fitting clothing. Do not wear anything tight around your waist that causes pressure on your abdomen.  Raise (elevate) the head of your bed 6 inches (15cm).  Try to reduce your stress, such as with yoga or meditation. If you need help reducing stress, ask your health care provider.  If you are overweight, reduce your weight to an amount that is healthy for you. Ask your health care provider for guidance about a safe weight loss goal.  Keep all follow-up visits as told by your health care provider. This is important. SEEK MEDICAL CARE IF:  You have new symptoms.  You have unexplained weight loss.  You have difficulty swallowing, or it hurts to swallow.  You have wheezing or a persistent cough.  Your symptoms do not improve with treatment.  You have a hoarse voice. SEEK IMMEDIATE MEDICAL CARE IF:  You have pain  in your arms, neck, jaw, teeth, or back.  You feel sweaty, dizzy, or light-headed.  You have chest pain or shortness of breath.  You vomit and your vomit looks like blood or coffee grounds.  You faint.  Your stool is bloody or black.  You cannot swallow, drink, or eat.   This information is not intended to replace advice given to you by your health care provider. Make sure you discuss any questions you have with  your health care provider.   Document Released: 04/28/2005 Document Revised: 04/09/2015 Document Reviewed: 11/13/2014 Elsevier Interactive Patient Education [employer].

## 2015-12-18 ENCOUNTER — Ambulatory Visit (INDEPENDENT_AMBULATORY_CARE_PROVIDER_SITE_OTHER): Payer: Managed Care, Other (non HMO)

## 2015-12-18 DIAGNOSIS — R1011 Right upper quadrant pain: Secondary | ICD-10-CM

## 2015-12-18 DIAGNOSIS — R932 Abnormal findings on diagnostic imaging of liver and biliary tract: Secondary | ICD-10-CM

## 2015-12-19 ENCOUNTER — Telehealth: Payer: Self-pay | Admitting: Family Medicine

## 2015-12-19 DIAGNOSIS — K828 Other specified diseases of gallbladder: Secondary | ICD-10-CM

## 2015-12-19 NOTE — Telephone Encounter (Signed)
Spoke with patient reviewed results and information regarding referral . Patient verbalized understanding.

## 2015-12-19 NOTE — Telephone Encounter (Signed)
Please call pt: - [place] abd was normal with the exception of "sludge" in the gallbladder. This could be causing her symptoms. This means her gallbladder is likely not functioning at full capacity. This is not emergent, but we should refer her to surgery to discuss/evaluate her options. I have placed this referral today. - her other labs are normal.

## 2015-12-22 ENCOUNTER — Ambulatory Visit (INDEPENDENT_AMBULATORY_CARE_PROVIDER_SITE_OTHER): Payer: Managed Care, Other (non HMO) | Admitting: Family Medicine

## 2015-12-22 ENCOUNTER — Encounter: Payer: Self-pay | Admitting: Family Medicine

## 2015-12-22 VITALS — BP 131/91 | HR 100 | Temp 97.9°F | Resp 20 | Ht 70.0 in | Wt 176.8 lb

## 2015-12-22 DIAGNOSIS — R002 Palpitations: Secondary | ICD-10-CM | POA: Insufficient documentation

## 2015-12-22 DIAGNOSIS — Z0000 Encounter for general adult medical examination without abnormal findings: Secondary | ICD-10-CM | POA: Diagnosis not present

## 2015-12-22 DIAGNOSIS — R7309 Other abnormal glucose: Secondary | ICD-10-CM

## 2015-12-22 DIAGNOSIS — Z23 Encounter for immunization: Secondary | ICD-10-CM | POA: Diagnosis not present

## 2015-12-22 DIAGNOSIS — I10 Essential (primary) hypertension: Secondary | ICD-10-CM

## 2015-12-22 MED ORDER — HYDROCHLOROTHIAZIDE 25 MG PO TABS: 25.0000 mg | ORAL_TABLET | Freq: Every day | ORAL | Status: DC

## 2015-12-22 NOTE — Patient Instructions (Signed)
Health Maintenance, Female Adopting a healthy lifestyle and getting preventive care can go a long way to promote health and wellness. Talk with your health care provider about what schedule of regular examinations is right for you. This is a good chance for you to check in with your provider about disease prevention and staying healthy. In between checkups, there are plenty of things you can do on your own. Experts have done a lot of research about which lifestyle changes and preventive measures are most likely to keep you healthy. Ask your health care provider for more information. WEIGHT AND DIET  Eat a healthy diet  Be sure to include plenty of vegetables, fruits, low-fat dairy products, and lean protein.  Do not eat a lot of foods high in solid fats, added sugars, or salt.  Get regular exercise. This is one of the most important things you can do for your health.  Most adults should exercise for at least 150 minutes each week. The exercise should increase your heart rate and make you sweat (moderate-intensity exercise).  Most adults should also do strengthening exercises at least twice a week. This is in addition to the moderate-intensity exercise.  Maintain a healthy weight  Body mass index (BMI) is a measurement that can be used to identify possible weight problems. It estimates body fat based on height and weight. Your health care provider can help determine your BMI and help you achieve or maintain a healthy weight.  For females 20 years of age and older:   A BMI below 18.5 is considered underweight.  A BMI of 18.5 to 24.9 is normal.  A BMI of 25 to 29.9 is considered overweight.  A BMI of 30 and above is considered obese.  Watch levels of cholesterol and blood lipids  You should start having your blood tested for lipids and cholesterol at 43 years of age, then have this test every 5 years.  You may need to have your cholesterol levels checked more often if:  Your lipid  or cholesterol levels are high.  You are older than 43 years of age.  You are at high risk for heart disease.  CANCER SCREENING   Lung Cancer  Lung cancer screening is recommended for adults 55-80 years old who are at high risk for lung cancer because of a history of smoking.  A yearly low-dose CT scan of the lungs is recommended for people who:  Currently smoke.  Have quit within the past 15 years.  Have at least a 30-pack-year history of smoking. A pack year is smoking an average of one pack of cigarettes a day for 1 year.  Yearly screening should continue until it has been 15 years since you quit.  Yearly screening should stop if you develop a health problem that would prevent you from having lung cancer treatment.  Breast Cancer  Practice breast self-awareness. This means understanding how your breasts normally appear and feel.  It also means doing regular breast self-exams. Let your health care provider know about any changes, no matter how small.  If you are in your 20s or 30s, you should have a clinical breast exam (CBE) by a health care provider every 1-3 years as part of a regular health exam.  If you are 40 or older, have a CBE every year. Also consider having a breast X-ray (mammogram) every year.  If you have a family history of breast cancer, talk to your health care provider about genetic screening.  If you   are at high risk for breast cancer, talk to your health care provider about having an MRI and a mammogram every year.  Breast cancer gene (BRCA) assessment is recommended for women who have family members with BRCA-related cancers. BRCA-related cancers include:  Breast.  Ovarian.  Tubal.  Peritoneal cancers.  Results of the assessment will determine the need for genetic counseling and BRCA1 and BRCA2 testing. Cervical Cancer Your health care provider may recommend that you be screened regularly for cancer of the pelvic organs (ovaries, uterus, and  vagina). This screening involves a pelvic examination, including checking for microscopic changes to the surface of your cervix (Pap test). You may be encouraged to have this screening done every 3 years, beginning at age 21.  For women ages 30-65, health care providers may recommend pelvic exams and Pap testing every 3 years, or they may recommend the Pap and pelvic exam, combined with testing for human papilloma virus (HPV), every 5 years. Some types of HPV increase your risk of cervical cancer. Testing for HPV may also be done on women of any age with unclear Pap test results.  Other health care providers may not recommend any screening for nonpregnant women who are considered low risk for pelvic cancer and who do not have symptoms. Ask your health care provider if a screening pelvic exam is right for you.  If you have had past treatment for cervical cancer or a condition that could lead to cancer, you need Pap tests and screening for cancer for at least 20 years after your treatment. If Pap tests have been discontinued, your risk factors (such as having a new sexual partner) need to be reassessed to determine if screening should resume. Some women have medical problems that increase the chance of getting cervical cancer. In these cases, your health care provider may recommend more frequent screening and Pap tests. Colorectal Cancer  This type of cancer can be detected and often prevented.  Routine colorectal cancer screening usually begins at 43 years of age and continues through 43 years of age.  Your health care provider may recommend screening at an earlier age if you have risk factors for colon cancer.  Your health care provider may also recommend using home test kits to check for hidden blood in the stool.  A small camera at the end of a tube can be used to examine your colon directly (sigmoidoscopy or colonoscopy). This is done to check for the earliest forms of colorectal  cancer.  Routine screening usually begins at age 50.  Direct examination of the colon should be repeated every 5-10 years through 43 years of age. However, you may need to be screened more often if early forms of precancerous polyps or small growths are found. Skin Cancer  Check your skin from head to toe regularly.  Tell your health care provider about any new moles or changes in moles, especially if there is a change in a mole's shape or color.  Also tell your health care provider if you have a mole that is larger than the size of a pencil eraser.  Always use sunscreen. Apply sunscreen liberally and repeatedly throughout the day.  Protect yourself by wearing long sleeves, pants, a wide-brimmed hat, and sunglasses whenever you are outside. HEART DISEASE, DIABETES, AND HIGH BLOOD PRESSURE   High blood pressure causes heart disease and increases the risk of stroke. High blood pressure is more likely to develop in:  People who have blood pressure in the high end   of the normal range (130-139/85-89 mm Hg).  People who are overweight or obese.  People who are African American.  If you are 38-23 years of age, have your blood pressure checked every 3-5 years. If you are 61 years of age or older, have your blood pressure checked every year. You should have your blood pressure measured twice--once when you are at a hospital or clinic, and once when you are not at a hospital or clinic. Record the average of the two measurements. To check your blood pressure when you are not at a hospital or clinic, you can use:  An automated blood pressure machine at a pharmacy.  A home blood pressure monitor.  If you are between 45 years and 39 years old, ask your health care provider if you should take aspirin to prevent strokes.  Have regular diabetes screenings. This involves taking a blood sample to check your fasting blood sugar level.  If you are at a normal weight and have a low risk for diabetes,  have this test once every three years after 43 years of age.  If you are overweight and have a high risk for diabetes, consider being tested at a younger age or more often. PREVENTING INFECTION  Hepatitis B  If you have a higher risk for hepatitis B, you should be screened for this virus. You are considered at high risk for hepatitis B if:  You were born in a country where hepatitis B is common. Ask your health care provider which countries are considered high risk.  Your parents were born in a high-risk country, and you have not been immunized against hepatitis B (hepatitis B vaccine).  You have HIV or AIDS.  You use needles to inject street drugs.  You live with someone who has hepatitis B.  You have had sex with someone who has hepatitis B.  You get hemodialysis treatment.  You take certain medicines for conditions, including cancer, organ transplantation, and autoimmune conditions. Hepatitis C  Blood testing is recommended for:  Everyone born from [birth year] through 1963.  Anyone with known risk factors for hepatitis C. Sexually transmitted infections (STIs)  You should be screened for sexually transmitted infections (STIs) including gonorrhea and chlamydia if:  You are sexually active and are younger than 43 years of age.  You are older than 43 years of age and your health care provider tells you that you are at risk for this type of infection.  Your sexual activity has changed since you were last screened and you are at an increased risk for chlamydia or gonorrhea. Ask your health care provider if you are at risk.  If you do not have HIV, but are at risk, it may be recommended that you take a prescription medicine daily to prevent HIV infection. This is called pre-exposure prophylaxis (PrEP). You are considered at risk if:  You are sexually active and do not regularly use condoms or know the HIV status of your partner(s).  You take drugs by injection.  You are sexually  active with a partner who has HIV. Talk with your health care provider about whether you are at high risk of being infected with HIV. If you choose to begin PrEP, you should first be tested for HIV. You should then be tested every 3 months for as long as you are taking PrEP.  PREGNANCY   If you are premenopausal and you may become pregnant, ask your health care provider about preconception counseling.  If you may  become pregnant, take 400 to 800 micrograms (mcg) of folic acid every day.  If you want to prevent pregnancy, talk to your health care provider about birth control (contraception). OSTEOPOROSIS AND MENOPAUSE   Osteoporosis is a disease in which the bones lose minerals and strength with aging. This can result in serious bone fractures. Your risk for osteoporosis can be identified using a bone density scan.  If you are 35 years of age or older, or if you are at risk for osteoporosis and fractures, ask your health care provider if you should be screened.  Ask your health care provider whether you should take a calcium or vitamin D supplement to lower your risk for osteoporosis.  Menopause may have certain physical symptoms and risks.  Hormone replacement therapy may reduce some of these symptoms and risks. Talk to your health care provider about whether hormone replacement therapy is right for you.  HOME CARE INSTRUCTIONS   Schedule regular health, dental, and eye exams.  Stay current with your immunizations.   Do not use any tobacco products including cigarettes, chewing tobacco, or electronic cigarettes.  If you are pregnant, do not drink alcohol.  If you are breastfeeding, limit how much and how often you drink alcohol.  Limit alcohol intake to no more than 1 drink per day for nonpregnant women. One drink equals 12 ounces of beer, 5 ounces of wine, or 1 ounces of hard liquor.  Do not use street drugs.  Do not share needles.  Ask your health care provider for help if  you need support or information about quitting drugs.  Tell your health care provider if you often feel depressed.  Tell your health care provider if you have ever been abused or do not feel safe at home.   This information is not intended to replace advice given to you by your health care provider. Make sure you discuss any questions you have with your health care provider.   Document Released: 02/01/2011 Document Revised: 08/09/2014 Document Reviewed: 06/20/2013 Elsevier Interactive Patient Education 2016 [place].  High Cholesterol High cholesterol refers to having a high level of cholesterol in your blood. Cholesterol is a white, waxy, fat-like protein that your body needs in small amounts. Your liver makes all the cholesterol you need. Excess cholesterol comes from the food you eat. Cholesterol travels in your bloodstream through your blood vessels. If you have high cholesterol, deposits (plaque) may build up on the walls of your blood vessels. This makes the arteries narrower and stiffer. Plaque increases your risk of heart attack and stroke. Work with your health care provider to keep your cholesterol levels in a healthy range. RISK FACTORS Several things can make you more likely to have high cholesterol. These include:   Eating foods high in animal fat (saturated fat) or cholesterol.  Being overweight.  Not getting enough exercise.  Having a family history of high cholesterol. SIGNS AND SYMPTOMS High cholesterol does not cause symptoms. DIAGNOSIS  Your health care provider can do a blood test to check whether you have high cholesterol. If you are older than 20, your health care provider may check your cholesterol every 4-6 years. You may be checked more often if you already have high cholesterol or other risk factors for heart disease. The blood test for cholesterol measures the following:  Bad cholesterol (LDL cholesterol). This is the type of cholesterol that causes heart  disease. This number should be less than 100.  Good cholesterol (HDL cholesterol). This type  helps protect against heart disease. A healthy level of HDL cholesterol is 60 or higher.  Total cholesterol. This is the combined number of LDL cholesterol and HDL cholesterol. A healthy number is less than 200. TREATMENT  High cholesterol can be treated with diet changes, lifestyle changes, and medicine.   Diet changes may include eating more whole grains, fruits, vegetables, nuts, and fish. You may also have to cut back on red meat and foods with a lot of added sugar.  Lifestyle changes may include getting at least 40 minutes of aerobic exercise three times a week. Aerobic exercises include walking, biking, and swimming. Aerobic exercise along with a healthy diet can help you maintain a healthy weight. Lifestyle changes may also include quitting smoking.  If diet and lifestyle changes are not enough to lower your cholesterol, your health care provider may prescribe a statin medicine. This medicine has been shown to lower cholesterol and also lower the risk of heart disease. HOME CARE INSTRUCTIONS  Only take over-the-counter or prescription medicines as directed by your health care provider.   Follow a healthy diet as directed by your health care provider. For instance:   Eat chicken (without skin), fish, veal, shellfish, ground [place] breast, and round or loin cuts of red meat.  Do not eat fried foods and fatty meats, such as hot dogs and salami.   Eat plenty of fruits, such as apples.   Eat plenty of vegetables, such as broccoli, potatoes, and carrots.   Eat beans, peas, and lentils.   Eat grains, such as barley, rice, couscous, and bulgur wheat.   Eat pasta without cream sauces.   Use skim or nonfat milk and low-fat or nonfat yogurt and cheeses. Do not eat or drink whole milk, cream, ice cream, egg yolks, and hard cheeses.   Do not eat stick margarine or tub margarines that  contain trans fats (also called partially hydrogenated oils).   Do not eat cakes, cookies, crackers, or other baked goods that contain trans fats.   Do not eat saturated tropical oils, such as coconut and palm oil.   Exercise as directed by your health care provider. Increase your activity level with activities such as gardening or walking.   Keep all follow-up appointments.  SEEK MEDICAL CARE IF:  You are struggling to maintain a healthy diet or weight.  You need help starting an exercise program.  You need help to stop smoking. SEEK IMMEDIATE MEDICAL CARE IF:  You have chest pain.  You have trouble breathing.   This information is not intended to replace advice given to you by your health care provider. Make sure you discuss any questions you have with your health care provider.   Document Released: 07/19/2005 Document Revised: 08/09/2014 Document Reviewed: 05/11/2013 Elsevier Interactive Patient Education 2016 [employer]   Prediabetes Eating Plan Prediabetes--also called impaired glucose tolerance or impaired fasting glucose--is a condition that causes blood sugar (blood glucose) levels to be higher than normal. Following a healthy diet can help to keep prediabetes under control. It can also help to lower the risk of type 2 diabetes and heart disease, which are increased in people who have prediabetes. Along with regular exercise, a healthy diet:  Promotes weight loss.  Helps to control blood sugar levels.  Helps to improve the way that the body uses insulin. WHAT DO I NEED TO KNOW ABOUT THIS EATING PLAN?  Use the glycemic index (GI) to plan your meals. The index tells you how quickly a food  will raise your blood sugar. Choose low-GI foods. These foods take a longer time to raise blood sugar.  Pay close attention to the amount of carbohydrates in the food that you eat. Carbohydrates increase blood sugar levels.  Keep track of how many calories you take in. Eating  the right amount of calories will help you to achieve a healthy weight. Losing about 7 percent of your starting weight can help to prevent type 2 diabetes.  You may want to follow a Mediterranean diet. This diet includes a lot of vegetables, lean meats or fish, whole grains, fruits, and healthy oils and fats. WHAT FOODS CAN I EAT? Grains Whole grains, such as whole-wheat or whole-grain breads, crackers, cereals, and pasta. Unsweetened oatmeal. Bulgur. Barley. Quinoa. Brown rice. Corn or whole-wheat flour tortillas or taco shells. Vegetables Lettuce. Spinach. Peas. Beets. Cauliflower. Cabbage. Broccoli. Carrots. Tomatoes. Squash. Eggplant. Herbs. Peppers. Onions. Cucumbers. Brussels sprouts. Fruits Berries. Bananas. Apples. Oranges. Grapes. Papaya. Mango. Pomegranate. Kiwi. Grapefruit. Cherries. Meats and Other Protein Sources Seafood. Lean meats, such as chicken and [place] or lean cuts of pork and beef. Tofu. Eggs. Nuts. Beans. Dairy Low-fat or fat-free dairy products, such as yogurt, cottage cheese, and cheese. Beverages Water. Tea. Coffee. Sugar-free or diet soda. Seltzer water. Milk. Milk alternatives, such as soy or almond milk. Condiments Mustard. Relish. Low-fat, low-sugar ketchup. Low-fat, low-sugar barbecue sauce. Low-fat or fat-free mayonnaise. Sweets and Desserts Sugar-free or low-fat pudding. Sugar-free or low-fat ice cream and other frozen treats. Fats and Oils Avocado. Walnuts. Olive oil. The items listed above may not be a complete list of recommended foods or beverages. Contact your dietitian for more options.  WHAT FOODS ARE NOT RECOMMENDED? Grains Refined white flour and flour products, such as bread, pasta, snack foods, and cereals. Beverages Sweetened drinks, such as sweet iced tea and soda. Sweets and Desserts Baked goods, such as cake, cupcakes, pastries, cookies, and cheesecake. The items listed above may not be a complete list of foods and beverages to avoid.  Contact your dietitian for more information.   This information is not intended to replace advice given to you by your health care provider. Make sure you discuss any questions you have with your health care provider.   Document Released: 12/03/2014 Document Reviewed: 12/03/2014 Elsevier Interactive Patient Education 2016 [place].   3 g of fish oil supplement daily, >150 minutes of exercise a week.  - Nutrition referral for prediabetes.

## 2015-12-22 NOTE — Progress Notes (Signed)
Patient ID: Ann Cain, female   DOB: 17-Dec-1970, 45 y.o.   MRN: [id number]      Patient ID: Ann Cain, female  DOB: [birth year], 45 y.o.   MRN: [id number]  Subjective:  Ann Cain is a 45 y.o. female present for annual well exam. All past medical history, surgical history, allergies, family history, immunizations, medications and social history were updated in the electronic medical record today. All recent labs, ED visits and hospitalizations within the last year were reviewed.  HTN- patient reports compliance with medications today. She is tolerating HCTZ BID. Blood pressure is closer to range, and pt states she is feeling better. She is monitoring the salt content in her diet. She could not exercise secondary to ABD discomfort, but feels she can start to get back into at least yoga.   Prediabetes: discussed elevated fasting glucose and a1c today. 5.9. Pt is in the prediabetes range. She is not routinely exercising yet, and FG 102. She has not monitored her diet for carbs, but does not feel she eats much sugar. She denies neuropathy or nonhealing wounds.   Hypercholesterolemia: discussed pts elevated lipid panel. She does have a FH- paternal grandparents of heart disease. She likes to to try "natural" regimens. Exercise/diet per above.    Palpitations: pt has endorsed palpitations 4-5 daily, mostly occuring after meals and at night. She states they sometimes wake her up between 2-4 am with a  heart pounding sensatoin. She reports episodes last about 5-10 minutes and self resolve. She is discontinued caffeine, with the exception of 1 cup of caffeine a day. She denies any chest pain, shortness of breath, dizziness or syncope.   Health maintenance:  Colonoscopy: 03/02/2014; adenoma, 3 year follow-up due August 2018. Mammogram: 10/2014; normal, has completed at gynecologist. Normal per patient. Cervical cancer screening: 08/02/2013, hysterectomy, GYN [facility] Immunizations: tdap  needed, pt uncertain last but likely with pregnancy >10 years ago.  Infectious disease screening: HIV with pregnancy 2014 DEXA:Not indicated  Last eye exam 2015 Assistive device: None Oxygen [id number] Patient has a Dental home. Hospitalizations/ED visits: None   Recent Results (from the past 2160 hour(s))  CBC w/Diff     Status: Abnormal   Collection Time: 12/15/15  9:04 AM  Result Value Ref Range   WBC 6.2 4.0 - 10.5 K/uL   RBC 5.22 (H) 3.87 - 5.11 Mil/uL   Hemoglobin 15.9 (H) 12.0 - 15.0 g/dL   HCT 47.4 (H) 36.0 - 46.0 %   MCV 90.8 78.0 - 100.0 fl   MCHC 33.6 30.0 - 36.0 g/dL   RDW 13.1 11.5 - 15.5 %   Platelets 240.0 150.0 - 400.0 K/uL   Neutrophils Relative % 65.8 43.0 - 77.0 %   Lymphocytes Relative 18.8 12.0 - 46.0 %   Monocytes Relative 12.7 (H) 3.0 - 12.0 %   Eosinophils Relative 2.1 0.0 - 5.0 %   Basophils Relative 0.6 0.0 - 3.0 %   Neutro Abs 4.1 1.4 - 7.7 K/uL   Lymphs Abs 1.2 0.7 - 4.0 K/uL   Monocytes Absolute 0.8 0.1 - 1.0 K/uL   Eosinophils Absolute 0.1 0.0 - 0.7 K/uL   Basophils Absolute 0.0 0.0 - 0.1 K/uL  Comprehensive metabolic panel     Status: Abnormal   Collection Time: 12/15/15  9:04 AM  Result Value Ref Range   Sodium 138 135 - 145 mEq/L   Potassium 3.7 3.5 - 5.1 mEq/L   Chloride 98 96 - 112 mEq/L   CO2 32 19 -  32 mEq/L   Glucose, Bld 102 (H) 70 - 99 mg/dL   BUN 17 6 - 23 mg/dL   Creatinine, Ser 0.96 0.40 - 1.20 mg/dL   Total Bilirubin 0.9 0.2 - 1.2 mg/dL   Alkaline Phosphatase 56 39 - 117 U/L   AST 17 0 - 37 U/L   ALT 29 0 - 35 U/L   Total Protein 7.6 6.0 - 8.3 g/dL   Albumin 4.9 3.5 - 5.2 g/dL   Calcium 10.2 8.4 - 10.5 mg/dL   GFR 67.38 >60.00 mL/min  HgB A1c     Status: None   Collection Time: 12/15/15  9:04 AM  Result Value Ref Range   Hgb A1c MFr Bld 5.9 4.6 - 6.5 %    Comment: Glycemic Control Guidelines for People with Diabetes:Non Diabetic:  <6%Goal of Therapy: <7%Additional Action Suggested:  >8%   Lipid panel     Status: Abnormal     Collection Time: 12/15/15  9:04 AM  Result Value Ref Range   Cholesterol 231 (H) 0 - 200 mg/dL    Comment: ATP III Classification       Desirable:  < 200 mg/dL               Borderline High:  200 - 239 mg/dL          High:  > = 240 mg/dL   Triglycerides 126.0 0.0 - 149.0 mg/dL    Comment: Normal:  <150 mg/dLBorderline High:  150 - 199 mg/dL   HDL 42.40 >39.00 mg/dL   VLDL 25.2 0.0 - 40.0 mg/dL   LDL Cholesterol 163 (H) 0 - 99 mg/dL   Total CHOL/HDL Ratio 5     Comment:                Men          Women1/2 Average Risk     3.4          3.3Average Risk          5.0          4.42X Average Risk          9.6          7.13X Average Risk          15.0          11.0                       NonHDL 188.65     Comment: NOTE:  Non-HDL goal should be 30 mg/dL higher than patient's LDL goal (i.e. LDL goal of < 70 mg/dL, would have non-HDL goal of < 100 mg/dL)  TSH     Status: None   Collection Time: 12/15/15  9:04 AM  Result Value Ref Range   TSH 1.75 0.35 - 4.50 uIU/mL  H. pylori antibody, IgG     Status: None   Collection Time: 12/15/15 11:43 AM  Result Value Ref Range   H Pylori IgG Negative Negative  Lipase     Status: None   Collection Time: 12/15/15 11:43 AM  Result Value Ref Range   Lipase 37.0 11.0 - 59.0 U/L     Past Medical History  Diagnosis Date  . Genital HSV   . Hemorrhoid   . Hypothyroidism   . Anal fissure   . Allergic rhinitis     [place] allergy/asthma patient  . Colon polyp 2015    tubular adenoma  . Rectal bleeding   .  Acid reflux 2012    egd  . Allergy   . Asthma   . Migraines   . History of chicken pox   . Fibroid uterus    No Known Allergies Past Surgical History  Procedure Laterality Date  . Cesarean section  2002, 2004  . Colonoscopy  2015    Dr. Claudie Leach internal hemorrhoids, 2 polyps  . Dental surgery    . Abdominal hysterectomy     Family History  Problem Relation Age of Onset  . Kidney cancer Father 6  . Lung cancer Maternal Grandfather   .  Colon cancer Paternal Grandmother 28  . Heart disease Paternal Grandmother   . Heart disease Paternal Grandfather    Social History   Social History  . Marital Status: Married    Spouse Name: N/A  . Number of Children: N/A  . Years of Education: N/A   Occupational History  . Not on file.   Social History Main Topics  . Smoking status: Never Smoker   . Smokeless tobacco: Never Used  . Alcohol Use: No  . Drug Use: No  . Sexual Activity: Yes    Birth Control/ Protection: None   Other Topics Concern  . Not on file   Social History Narrative   Married to [place]. 3 children (one set of twins). 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 [name], [name].   College education. Masters degree, stay-at-home mother.   No tobacco use, no recreational drugs, occasional alcohol.   Drinks caffeinated beverages, takes a daily vitamin   Wears her seatbelt and bicycle helmet   Smoke detector in the home    Feels safe in her relationship.   ROS: Negative, with the exception of above mentioned in HPI  Objective: BP 131/91 mmHg  Pulse 100  Temp(Src) 97.9 F (36.6 C) (Oral)  Resp 20  Ht 5\' 10"  (1.778 m)  Wt 176 lb 12 oz (80.173 kg)  BMI 25.36 kg/m2  SpO2 96% Gen: Afebrile. No acute distress. Nontoxic in appearance, well-developed, well-nourished, female, talkative   HENT: AT. [place]. Bilateral TM visualized and normal in appearance, normal external auditory canal. MMM, no oral lesions, Good dentition. Bilateral nares without erythema or swelling. Throat without erythema, ulcerations or exudates. No Cough on exam, No  hoarseness on exam. Eyes:Pupils Equal Round Reactive to light, Extraocular movements intact,  Conjunctiva without redness, discharge or icterus. Neck/lymp/endocrine: Supple,no lymphadenopathy, no thyromegaly CV: RRR, No edema, +2/4 P posterior tibialis pulses. Nocarotid bruits. No JVD. Chest: CTAB, no wheeze, rhonchi or crackles. Normal Respiratory effort. Good  Air movement. Abd: Soft.  flat. ND. Mild RUQ discomfort TTP. BS present. No Masses palpated. No hepatosplenomegaly. No rebound tenderness or guarding. Skin: No  rashes, purpura or petechiae. Warm and well-perfused. Skin intact. Neuro/Msk: Normal gait. PERLA. EOMi. Alert. Oriented x3.  Cranial nerves II through XII intact. Muscle strength 5/5 UE/LE extremity. DTRs equal bilaterally. Psych: Normal affect, dress and demeanor. Normal speech. Normal thought content and judgment.   Assessment/plan: Ann Cain is a 45 y.o. female present for annual exam.  Palpitations - Pt to remain off caffeine. No stimulant use.  - Could not obtain baseline EKG today secondary to system being down.  - Ambulatory referral to Cardiology for eval and treat.  Elevated hemoglobin A1c - discussed elevation, diet, exercise and 6 month  Follow up.  - Amb ref to Medical Nutrition Therapy-MNT  Need for diphtheria-tetanus-pertussis (Tdap) vaccine, adult/adolescent - Tdap vaccine greater than or equal to 7yo IM  Essential hypertension - better  control today.  - Continue HCTZ 25 mg BID - hydrochlorothiazide (HYDRODIURIL) 25 MG tablet; Take 1 tablet (25 mg total) by mouth daily.  Dispense: 60 tablet; Refill: 5 - pt to monitor at home if routinely above 140/90 will need to see sooner.  - 3 month f/u  Hyperlipidemia: - diet and exercise counseling. Pt wanting to try behavior modifications and fish oil supplement first. - 6 month follow up with repeat lipid/a1c, if not improved will start statin.   Encounter for preventive health examination. Patient was encouraged to exercise greater than 150 minutes a week. Patient was encouraged to choose a diet filled with fresh fruits and vegetables, and lean meats. AVS provided to patient today for education/recommendation on gender specific health and safety maintenance, prediabetes and hyperlipidemia.  Return in about 3 months (around 03/23/2016) for HTN.  Electronically signed by: Howard Pouch,  DO [place]

## 2016-01-01 HISTORY — PX: OTHER SURGICAL HISTORY: SHX169

## 2016-01-02 ENCOUNTER — Other Ambulatory Visit: Payer: Self-pay | Admitting: Surgery

## 2016-01-16 ENCOUNTER — Other Ambulatory Visit: Payer: Self-pay | Admitting: *Deleted

## 2016-01-16 DIAGNOSIS — I10 Essential (primary) hypertension: Secondary | ICD-10-CM

## 2016-01-16 MED ORDER — HYDROCHLOROTHIAZIDE 25 MG PO TABS: 25.0000 mg | ORAL_TABLET | Freq: Every day | ORAL | Status: DC

## 2016-01-16 NOTE — Telephone Encounter (Signed)
Patient request 90 day supply new Rx sent.

## 2016-01-20 ENCOUNTER — Encounter (INDEPENDENT_AMBULATORY_CARE_PROVIDER_SITE_OTHER): Payer: Managed Care, Other (non HMO)

## 2016-01-20 ENCOUNTER — Ambulatory Visit (INDEPENDENT_AMBULATORY_CARE_PROVIDER_SITE_OTHER): Payer: Managed Care, Other (non HMO) | Admitting: Cardiology

## 2016-01-20 ENCOUNTER — Encounter: Payer: Self-pay | Admitting: Cardiology

## 2016-01-20 VITALS — BP 139/100 | HR 91 | Ht 70.0 in | Wt 168.2 lb

## 2016-01-20 DIAGNOSIS — I10 Essential (primary) hypertension: Secondary | ICD-10-CM

## 2016-01-20 DIAGNOSIS — R002 Palpitations: Secondary | ICD-10-CM

## 2016-01-20 DIAGNOSIS — Z01810 Encounter for preprocedural cardiovascular examination: Secondary | ICD-10-CM | POA: Diagnosis not present

## 2016-01-20 DIAGNOSIS — R079 Chest pain, unspecified: Secondary | ICD-10-CM

## 2016-01-20 DIAGNOSIS — R011 Cardiac murmur, unspecified: Secondary | ICD-10-CM

## 2016-01-20 NOTE — Patient Instructions (Addendum)
Your physician has recommended that you wear an event monitor FOR 30 DAYS. Event monitors are medical devices that record the heart's electrical activity. Doctors most often [place] these monitors to diagnose arrhythmias. Arrhythmias are problems with the speed or rhythm of the heartbeat. The monitor is a small, portable device. You can wear one while you do your normal daily activities. This is usually used to diagnose what is causing palpitations/syncope (passing out).  WILL BE DONE AT [facility] Your physician has requested that you have an echocardiogram. Echocardiography is a painless test that uses sound waves to create images of your heart. It provides your doctor with information about the size and shape of your heart and how well your heart's chambers and valves are working. This procedure takes approximately one hour. There are no restrictions for this procedure.  WILL BE DONE AT [facility] OFFICE Your physician has requested that you have an exercise tolerance test. For further information please visit [email or web address]. Please also follow instruction sheet, as given.    NO CHANGE WITH CURRENT MEDICATIONS    Your physician recommends that you schedule a follow-up appointment in 2 MONTHS WITH DR HARDING.- FOLLOW UP MONITOR RESULTS.

## 2016-01-20 NOTE — Progress Notes (Signed)
PCP: Howard Pouch, DO  Clinic Note: Chief Complaint  Patient presents with  . New Patient (Initial Visit)    LIGHTHEADED;randomly when getting up from sitting down. cramping in legs.     HPI: Ann Cain is a 45 y.o. female with a PMH -- hypertension, elevated A1c level below who presents today for cardiology consultation to evaluate chest pain and palpitations associated with lightheadedness. ~ 5 yr ago ? Thyroid storm (Hashimoto's). She was also told that time she had a murmur.  Studies Reviewed: n/a  Interval History: Ann Cain presents here today with a about a month history of fluttering. Basically everything began in early May she had an episode of abdominal discomfort bloating and nausea with without emesis. This was then followed later on by episodes of palpitations or irregular heart beating. All these episodes of irregular piece last less than an hour maybe 15-30 minutes at a time. She says he describes as a fluttering sensation in her chest and that she actually feel the pounding. Denies sleeping chest pain symptom. She says she is having about 2-3 episodes a week, but it has actually been better lately. She doesn't have any dizziness or wooziness associated with it just feels anxious about it. No sick. Near-syncope. She thinks that all this began on May 6 when she started having a bunch of strange symptoms that she attributed being related to possible gallbladder issues. Similar symptoms occurred back 5 years ago when she was diagnosed with Hashimoto's thyroiditis. She's had fever and chill type symptoms as well as abdominal pain and nausea. She was told that she had an ultrasound showing sludge in the gallbladder and has a pending cholecystectomy.  About the second week of the onset of the symptoms she started having a little bit chest tightness and pain in both the chest and arm. She felt a little bit short of breath as well. This seemed to be postprandial. This symptoms that  has not recurred, but this shortness of breath has on occasion, especially with a rapid heartbeats. She denies any other episodes of chest tightness or pressure with rest or exertion. The symptoms are not made worse with activity.  She denies any PND, orthopnea or edema. Has not had any near syncope or syncope, TIA or murmurs fugax. She has had some nausea without vomiting and no epistaxis or hematemesis.  No claudication.  ROS: A comprehensive was performed. Review of Systems  Constitutional: Negative for fever and chills.       She did have F/C in early May  Respiratory: Negative for cough, shortness of breath and wheezing.   Cardiovascular: Positive for chest pain (not since late May -- post-prandial) and palpitations. Negative for orthopnea, claudication, leg swelling and PND.  Gastrointestinal: Positive for nausea (5/6 - no emesis.  Minimal since then), abdominal pain and blood in stool (seeing GI for ? anal dissue - using cream). Negative for melena.  Genitourinary: Negative for hematuria.  Musculoskeletal: Negative.        Better with change in diet  Neurological: Positive for tingling (hands) and headaches (@ night - getting better; intermittent Migraine HA). Negative for loss of consciousness.  Psychiatric/Behavioral: Negative for depression and memory loss. The patient is nervous/anxious (with all of her Sx). The patient does not have insomnia (had difficulty sleeping in May with HA & plapitations).   All other systems reviewed and are negative.   Past Medical History  Diagnosis Date  . Genital HSV   . Hemorrhoid   .  Hypothyroidism   . Anal fissure   . Allergic rhinitis     [place] allergy/asthma patient  . Colon polyp 2015    tubular adenoma  . Rectal bleeding   . Acid reflux 2012    egd  . Allergy   . Asthma   . Migraines   . History of chicken pox   . Fibroid uterus     Past Surgical History  Procedure Laterality Date  . Cesarean section  2002, 2004  .  Colonoscopy  2015    Dr. Claudie Leach internal hemorrhoids, 2 polyps  . Dental surgery    . Abdominal hysterectomy      Prior to Admission medications   Medication Sig Start Date End Date Taking? Authorizing Provider  Albuterol Sulfate (PROAIR RESPICLICK) [id number] (90 Base) MCG/ACT AEPB  06/06/15   Historical Provider, MD  fexofenadine (ALLEGRA) 60 MG tablet Take 60 mg by mouth 2 (two) times daily.    Historical Provider, MD  hydrochlorothiazide (HYDRODIURIL) 25 MG tablet Take 1 tablet (25 mg total) by mouth daily. 01/16/16   Renee A Kuneff, DO  hydrocortisone (ANUSOL-HC) 25 MG suppository INSERT 1 (ONE) SUPPOSITORY, RECTAL, TWO TIMES DAILY 12/02/15   Historical Provider, MD  levothyroxine (SYNTHROID, LEVOTHROID) 100 MCG tablet  05/14/15   Historical Provider, MD  NONFORMULARY OR COMPOUNDED ITEM Diltiazem HCL 2%    Historical Provider, MD  omeprazole (PRILOSEC) 40 MG capsule Take 1 capsule (40 mg total) by mouth daily. 12/17/15   Renee A Kuneff, DO  Probiotic Product (ALIGN) 4 MG CAPS Take 1 capsule (4 mg total) by mouth daily. 12/17/15   Renee A Kuneff, DO  tretinoin (RETIN-A) 0.025 % cream APPLY 1 APPLICATION A PEARL-SIZED AMOUNT TO FACE IN THE EVENING ONCE A DAY EXTERNALLY 30 DAYS 10/21/15   Historical Provider, MD  valACYclovir (VALTREX) 500 MG tablet TAKE 1 TABLET BY MOUTH DAILY AS PREVENTION, INCREASE TO TWICE A DAY FOR 3 DAYS WITH LESIONS 10/22/15   Historical Provider, MD    No Known Allergies   Social History   Social History  . Marital Status: Married    Spouse Name: N/A  . Number of Children: N/A  . Years of Education: N/A   Social History Main Topics  . Smoking status: Current Every Day Smoker -- 2.00 packs/day  . Smokeless tobacco: Never Used  . Alcohol Use: No  . Drug Use: No  . Sexual Activity: Yes    Birth Control/ Protection: None   Other Topics Concern  . None   Social History Narrative   Married to [place]. 3 children (one set of twins). 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 Ann Cain,  Ann Cain.   College education. Masters degree, stay-at-home mother.   No tobacco use, no recreational drugs, occasional alcohol.   Drinks caffeinated beverages, takes a daily vitamin   Wears her seatbelt and bicycle helmet   Smoke detector in the home    Feels safe in her relationship.    Family History  Problem Relation Age of Onset  . Kidney cancer Father 53  . Lung cancer Maternal Grandfather   . Colon cancer Paternal Grandmother 40  . Heart disease Paternal Grandmother   . Heart disease Paternal Grandfather     Wt Readings from Last 3 Encounters:  01/20/16 168 lb 3.2 oz (76.295 kg)  12/22/15 176 lb 12 oz (80.173 kg)  12/17/15 179 lb 8 oz (81.421 kg)    PHYSICAL EXAM BP 139/100 mmHg  Pulse 91  Ht 5\' 10"  (1.778 m)  Wt  168 lb 3.2 oz (76.295 kg)  BMI 24.13 kg/m2 General appearance: alert, cooperative, appears stated age, no distress and Well-nourished, well-groomed. Healthy-appearing. Somewhat anxious however. Normal mood and affect. Neck: no adenopathy, no carotid bruit and no JVD Lungs: clear to auscultation bilaterally, normal percussion bilaterally and non-labored Heart: regular rate and rhythm, S1 & S2 normal, no obvious murmur (however cannot exclude soft SEM) , click, rub or gallop; nondisplaced PMI Abdomen: soft, non-tender; bowel sounds normal; no masses,  no organomegaly; no HJR  Extremities: extremities normal, atraumatic, no cyanosis,ora  Pulses: 2+ and symmetric;  Skin: mobility and turgor normal, no edema, no evidence of bleeding or bruising and no lesions noted  Neurologic: Mental status: Alert, oriented, thought content appropriate Cranial nerves: normal (II-XII grossly intact)    Adult ECG Report  Rate: 91 ;  Rhythm: normal sinus rhythm and Left axis deviation (-38). Incomplete RBBB, nonspecific T-wave abnormalities. Borderline QTC (499).;   Narrative Interpretation: Mildly abnormal EKG, but no acute findings.  Other studies Reviewed: Additional  studies/ records that were reviewed today include:  Recent Labs:  PCP - concerned re: HTN, HLD & high CBG (Metabolic Syndrome) - now on diet, plan recheck in 42month   Lab Results  Component Value Date   CHOL 231* 12/15/2015   HDL 42.40 12/15/2015   LDLCALC 163* 12/15/2015   TRIG 126.0 12/15/2015   CHOLHDL 5 12/15/2015   Lab Results  Component Value Date   TSH 1.75 12/15/2015   Lab Results  Component Value Date   HGB 15.9* 12/15/2015    ASSESSMENT / PLAN: Problem List Items Addressed This Visit    Pre-operative cardiovascular examination    She is due to have a cholecystectomy, but with her cardiac complaints is likely requiring cardiovascular risk assessment.  Until we know the etiology of her symptoms, cannot give a true evaluation. However in that regard, we are proceeding with a GXT for ischemic evaluation, event monitor for arrhythmia detection as well as an echocardiogram for functionality evaluation.      Relevant Orders   EKG 12-Lead   Cardiac event monitor   EXERCISE TOLERANCE TEST   ECHOCARDIOGRAM COMPLETE (Completed)   Palpitations - Primary    Not really clear what sounds like she may be having brief runs of PSVT or simply just bigeminy type pattern. Probably triggered by her anxiety from underlying illness. Sounds like she's having the episodes a week, but desperately now. I think we'll have to use a formal monitor the emergency room exactly is happening. Thankfully, she is not symptomatic syncope or near syncope standpoint.   Plan: 30 day event monitor. Also check 2-D echocardiogram to exclude structural abnormality.        Relevant Orders   EKG 12-Lead   Cardiac event monitor   EXERCISE TOLERANCE TEST   ECHOCARDIOGRAM COMPLETE (Completed)   Pain in the chest    The chest discomfort she described probably is more related to her gallbladder issue, however with no active cardiac complaints and preoperative assessment pending, is not unreasonable to do an  ischemic assessment.  Her EKG is a little abnormal baseline, but should be accurately interpreted with stress test. Plan: GXT- (if for whatever reason, unable to get adequate heart rate or interpretable results, and probably proceed stress echocardiogram.      Relevant Orders   EKG 12-Lead   Cardiac event monitor   EXERCISE TOLERANCE TEST   ECHOCARDIOGRAM COMPLETE (Completed)   Murmur    I did not hear murmur exam,  I suspect that the murmur heard several years ago was a flow murmur. However in the interest of completeness with chest pain and palpitations, will check a 2-D echocardiogram as part of a preoperative evaluation.      Relevant Orders   EKG 12-Lead   Cardiac event monitor   EXERCISE TOLERANCE TEST   ECHOCARDIOGRAM COMPLETE (Completed)   Essential hypertension (Chronic)    Blood pressure is borderline here today on HCTZ alone. We should have room for either beta blocker or calcium channel blocker to treat her symptoms if necessary.      Relevant Orders   EKG 12-Lead   Cardiac event monitor   EXERCISE TOLERANCE TEST   ECHOCARDIOGRAM COMPLETE (Completed)     Current medicines are reviewed at length with the patient today. (+/- concerns) none The following changes have been made: none  Studies Ordered:   Orders Placed This Encounter  Procedures  . Cardiac event monitor  . EXERCISE TOLERANCE TEST  . EKG 12-Lead  . ECHOCARDIOGRAM COMPLETE      Glenetta Hew, M.D., M.S. Interventional Cardiologist   Pager # [phone] Phone # [phone] [address]. [place], [place] [address]

## 2016-01-21 ENCOUNTER — Other Ambulatory Visit: Payer: Self-pay

## 2016-01-21 ENCOUNTER — Ambulatory Visit (HOSPITAL_COMMUNITY): Payer: Managed Care, Other (non HMO) | Attending: Cardiovascular Disease

## 2016-01-21 ENCOUNTER — Encounter: Payer: Self-pay | Admitting: Cardiology

## 2016-01-21 DIAGNOSIS — R079 Chest pain, unspecified: Secondary | ICD-10-CM

## 2016-01-21 DIAGNOSIS — I071 Rheumatic tricuspid insufficiency: Secondary | ICD-10-CM | POA: Diagnosis not present

## 2016-01-21 DIAGNOSIS — R011 Cardiac murmur, unspecified: Secondary | ICD-10-CM

## 2016-01-21 DIAGNOSIS — I10 Essential (primary) hypertension: Secondary | ICD-10-CM

## 2016-01-21 DIAGNOSIS — Z01810 Encounter for preprocedural cardiovascular examination: Secondary | ICD-10-CM

## 2016-01-21 DIAGNOSIS — R002 Palpitations: Secondary | ICD-10-CM | POA: Diagnosis not present

## 2016-01-21 DIAGNOSIS — R9431 Abnormal electrocardiogram [ECG] [EKG]: Secondary | ICD-10-CM | POA: Diagnosis present

## 2016-01-21 HISTORY — PX: TRANSTHORACIC ECHOCARDIOGRAM: SHX275

## 2016-01-21 LAB — ECHOCARDIOGRAM COMPLETE
CHL CUP MV DEC (S): 201
E decel time: 201 msec
E/e' ratio: 4.6
FS: 30 % (ref 28–44)
IVS/LV PW RATIO, ED: 1.05
LA diam end sys: 21 mm
LA vol A4C: 24 ml
LADIAMINDEX: 1.08 cm/m2
LASIZE: 21 mm
LV E/e' medial: 4.6
LV E/e'average: 4.6
LVELAT: 14.9 cm/s
LVOT SV: 69 mL
LVOT VTI: 22 cm
LVOT area: 3.14 cm2
LVOT diameter: 20 mm
LVOTPV: 111 cm/s
MV pk A vel: 60.7 m/s
MVPKEVEL: 68.6 m/s
PW: 9.11 mm — AB (ref 0.6–1.1)
Reg peak vel: 189 cm/s
TDI e' lateral: 14.9
TDI e' medial: 8.66
TR max vel: 189 cm/s

## 2016-01-21 NOTE — Assessment & Plan Note (Signed)
Not really clear what sounds like she may be having brief runs of PSVT or simply just bigeminy type pattern. Probably triggered by her anxiety from underlying illness. Sounds like she's having the episodes a week, but desperately now. I think we'll have to use a formal monitor the emergency room exactly is happening. Thankfully, she is not symptomatic syncope or near syncope standpoint.   Plan: 30 day event monitor. Also check 2-D echocardiogram to exclude structural abnormality.

## 2016-01-21 NOTE — Assessment & Plan Note (Signed)
Blood pressure is borderline here today on HCTZ alone. We should have room for either beta blocker or calcium channel blocker to treat her symptoms if necessary.

## 2016-01-21 NOTE — Assessment & Plan Note (Signed)
She is due to have a cholecystectomy, but with her cardiac complaints is likely requiring cardiovascular risk assessment.  Until we know the etiology of her symptoms, cannot give a true evaluation. However in that regard, we are proceeding with a GXT for ischemic evaluation, event monitor for arrhythmia detection as well as an echocardiogram for functionality evaluation.

## 2016-01-21 NOTE — Assessment & Plan Note (Signed)
I did not hear murmur exam, I suspect that the murmur heard several years ago was a flow murmur. However in the interest of completeness with chest pain and palpitations, will check a 2-D echocardiogram as part of a preoperative evaluation.

## 2016-01-21 NOTE — Progress Notes (Signed)
Quick Note:  Echo results: Good news: Essentially normal echocardiogram and normal pump function (EF: 55-60%) .and normal valve function. No regional wall motion abnormalities = No signs to suggest heart attack..  There does not appear to be any structural abnormalities to explain palpitations. No mitral valve prolapse.  Glenetta Hew, MD      ______

## 2016-01-21 NOTE — Assessment & Plan Note (Signed)
The chest discomfort she described probably is more related to her gallbladder issue, however with no active cardiac complaints and preoperative assessment pending, is not unreasonable to do an ischemic assessment.  Her EKG is a little abnormal baseline, but should be accurately interpreted with stress test. Plan: GXT- (if for whatever reason, unable to get adequate heart rate or interpretable results, and probably proceed stress echocardiogram.

## 2016-01-23 ENCOUNTER — Ambulatory Visit (HOSPITAL_COMMUNITY)
Admission: RE | Admit: 2016-01-23 | Discharge: 2016-01-23 | Disposition: A | Discharge status: Home / Self Care | Payer: Managed Care, Other (non HMO) | Source: Ambulatory Visit | Attending: Cardiology | Admitting: Cardiology

## 2016-01-23 ENCOUNTER — Telehealth: Payer: Self-pay | Admitting: *Deleted

## 2016-01-23 DIAGNOSIS — Z01810 Encounter for preprocedural cardiovascular examination: Secondary | ICD-10-CM

## 2016-01-23 DIAGNOSIS — I10 Essential (primary) hypertension: Secondary | ICD-10-CM

## 2016-01-23 DIAGNOSIS — R079 Chest pain, unspecified: Secondary | ICD-10-CM | POA: Diagnosis not present

## 2016-01-23 DIAGNOSIS — R002 Palpitations: Secondary | ICD-10-CM | POA: Diagnosis not present

## 2016-01-23 DIAGNOSIS — R011 Cardiac murmur, unspecified: Secondary | ICD-10-CM

## 2016-01-23 LAB — EXERCISE TOLERANCE TEST
CHL CUP RESTING HR STRESS: 121 {beats}/min
CSEPED: 8 min
CSEPEDS: 0 s
Estimated workload: 10.1 METS
MPHR: 177 {beats}/min
Peak HR: 176 {beats}/min
Percent HR: 99 %
RPE: 15

## 2016-01-23 NOTE — Telephone Encounter (Signed)
-----   Message from [place], [place] sent at 01/23/2016 12:45 PM EDT ----- Regarding: ETT result Patient underwent outpatient ETT today for preop clearance for possible lap chole. I have discussed with Dr. Meda Coffee regarding ETT EKG, her EKG at rest shows incomplete RBBB, flattening of T wave and minimal diffuse ST depression. But the more she exercise, the better her EKG looks. She has no chest pain or any symptom at all with exercise. After reaching target HR during 2nd stage of Bruce protocol, I was able to push her into the 3rd stage without problem. She reached 10.10 METS  I reviewed the EKG with Dr. Meda Coffee, given recent normal Echo, she should be cleared for surgery by Dr. Coralie Keens.   Just FYI. Anderson Malta can you inform the patient?  Hilbert Corrigan PA Pager: [phone]

## 2016-01-23 NOTE — Telephone Encounter (Signed)
Called pt re: message below.Marland Kitchenlmptcb jw 6/23/7

## 2016-01-27 HISTORY — PX: OTHER SURGICAL HISTORY: SHX169

## 2016-01-28 NOTE — Telephone Encounter (Signed)
Spoke with pt.  She has been made aware that she has been cleared for lap chole s

## 2016-01-28 NOTE — Telephone Encounter (Signed)
Spoke with pt and she has been made aware that she has been cleared for lap chole sx and Dr. Lenise Arena nurse has been notified, Pt verbalized appreciation for the call.

## 2016-01-30 ENCOUNTER — Telehealth: Payer: Self-pay | Admitting: *Deleted

## 2016-01-30 NOTE — Telephone Encounter (Signed)
-----   Message from Leonie Man, MD sent at 01/24/2016 12:04 AM EDT ----- Colletta [place]: I reviewed stress test. I am enclosing a phone message from my my PAs who ran your stress test and reviewed with the reading physician. -- Dr. Ellyn Hack  "Regarding: ETT result Patient underwent outpatient ETT today for preop clearance for possible lap chole. I have discussed with Dr. Meda Coffee regarding ETT EKG, her EKG at rest shows incomplete RBBB, flattening of T wave and minimal diffuse ST depression. But the more she exercise, the better her EKG looks. She has no chest pain or any symptom at all with exercise. After reaching target HR during 2nd stage of Bruce protocol, I was able to push her into the 3rd stage without problem. She reached 10.10 METS  I reviewed the EKG with Dr. Meda Coffee, given recent normal Echo, she should be cleared for surgery by Dr. Coralie Keens. " - Almyra Deforest, [place]

## 2016-01-30 NOTE — Telephone Encounter (Signed)
LEFT MESSAGE TO CALL BACK RELEASE INFORMATION TO [facility]

## 2016-01-30 NOTE — Telephone Encounter (Signed)
-----   Message from Leonie Man, MD sent at 01/21/2016  5:55 PM EDT ----- Echo results: Good news: Essentially normal echocardiogram and normal pump function (EF: 55-60%) .and normal valve function.  No regional wall motion abnormalities = No signs to suggest heart attack..  There does not appear to be any structural abnormalities to explain palpitations. No mitral valve prolapse.  Glenetta Hew, MD

## 2016-02-09 ENCOUNTER — Other Ambulatory Visit: Payer: Self-pay | Admitting: *Deleted

## 2016-02-09 DIAGNOSIS — R1011 Right upper quadrant pain: Secondary | ICD-10-CM

## 2016-02-09 MED ORDER — OMEPRAZOLE 40 MG PO CPDR: 40.0000 mg | DELAYED_RELEASE_CAPSULE | Freq: Every day | ORAL | Status: DC

## 2016-02-09 NOTE — Telephone Encounter (Signed)
Omeprazole 90 day supply sent to pharmacy.

## 2016-02-17 NOTE — Patient Instructions (Addendum)
Ann Cain  02/17/2016   Your procedure is scheduled on: 02-19-16   Report to [facility] Main  Entrance take [facility]  elevators to 3rd floor to  [place] at 06:45 AM.  Call this number if you have problems the morning of surgery [phone]   Remember: ONLY 1 PERSON MAY GO WITH YOU TO SHORT STAY TO GET  READY MORNING OF [place].  Do not eat food or drink liquids :After Midnight.     Take these medicines the morning of surgery with A SIP OF WATER: Levothyroxine; May use Inhalers if needed; Valtrex                                You may not have any metal on your body including hair pins and              piercings  Do not wear jewelry, make-up, lotions, powders or perfumes, deodorant             Do not wear nail polish.  Do not shave  48 hours prior to surgery.     Do not bring valuables to the hospital. [place].  Contacts, dentures or bridgework may not be worn into surgery.      Patients discharged the day of surgery will not be allowed to drive home.  Name and phone number of your driver:[name] (husband)  _____________________________________________________________________             [facility] - Preparing for Surgery Before surgery, you can play an important role.  Because skin is not sterile, your skin needs to be as free of germs as possible.  You can reduce the number of germs on your skin by washing with CHG (chlorahexidine gluconate) soap before surgery.  CHG is an antiseptic cleaner which kills germs and bonds with the skin to continue killing germs even after washing. Please DO NOT use if you have an allergy to CHG or antibacterial soaps.  If your skin becomes reddened/irritated stop using the CHG and inform your nurse when you arrive at Short Stay. Do not shave (including legs and underarms) for at least 48 hours prior to the first CHG shower.  You may shave your  face/neck. Please follow these instructions carefully:  1.  Shower with CHG Soap the night before surgery and the  morning of Surgery.  2.  If you choose to wash your hair, wash your hair first as usual with your  normal  shampoo.  3.  After you shampoo, rinse your hair and body thoroughly to remove the  shampoo.                           4.  Use CHG as you would any other liquid soap.  You can apply chg directly  to the skin and wash                       Gently with a scrungie or clean washcloth.  5.  Apply the CHG Soap to your body ONLY FROM THE NECK DOWN.   Do not use on face/ open  Wound or open sores. Avoid contact with eyes, ears mouth and genitals (private parts).                       Wash face,  Genitals (private parts) with your normal soap.             6.  Wash thoroughly, paying special attention to the area where your surgery  will be performed.  7.  Thoroughly rinse your body with warm water from the neck down.  8.  DO NOT shower/wash with your normal soap after using and rinsing off  the CHG Soap.                9.  Pat yourself dry with a clean towel.            10.  Wear clean pajamas.            11.  Place clean sheets on your bed the night of your first shower and do not  sleep with pets. Day of Surgery : Do not apply any lotions/deodorants the morning of surgery.  Please wear clean clothes to the hospital/surgery center.  FAILURE TO FOLLOW THESE INSTRUCTIONS MAY RESULT IN THE CANCELLATION OF YOUR SURGERY PATIENT SIGNATURE_________________________________  NURSE SIGNATURE__________________________________  ________________________________________________________________________

## 2016-02-18 ENCOUNTER — Encounter (HOSPITAL_COMMUNITY): Payer: Self-pay

## 2016-02-18 ENCOUNTER — Encounter (HOSPITAL_COMMUNITY)
Admission: RE | Admit: 2016-02-18 | Discharge: 2016-02-18 | Disposition: A | Discharge status: Home / Self Care | Payer: Managed Care, Other (non HMO) | Source: Ambulatory Visit | Attending: Surgery | Admitting: Surgery

## 2016-02-18 DIAGNOSIS — Z79899 Other long term (current) drug therapy: Secondary | ICD-10-CM | POA: Diagnosis not present

## 2016-02-18 DIAGNOSIS — R079 Chest pain, unspecified: Secondary | ICD-10-CM

## 2016-02-18 DIAGNOSIS — J45909 Unspecified asthma, uncomplicated: Secondary | ICD-10-CM | POA: Diagnosis not present

## 2016-02-18 DIAGNOSIS — E039 Hypothyroidism, unspecified: Secondary | ICD-10-CM | POA: Diagnosis not present

## 2016-02-18 DIAGNOSIS — Z01810 Encounter for preprocedural cardiovascular examination: Secondary | ICD-10-CM | POA: Diagnosis not present

## 2016-02-18 DIAGNOSIS — K8010 Calculus of gallbladder with chronic cholecystitis without obstruction: Secondary | ICD-10-CM | POA: Diagnosis not present

## 2016-02-18 DIAGNOSIS — K219 Gastro-esophageal reflux disease without esophagitis: Secondary | ICD-10-CM | POA: Diagnosis not present

## 2016-02-18 DIAGNOSIS — R002 Palpitations: Secondary | ICD-10-CM

## 2016-02-18 DIAGNOSIS — R011 Cardiac murmur, unspecified: Secondary | ICD-10-CM

## 2016-02-18 DIAGNOSIS — I10 Essential (primary) hypertension: Secondary | ICD-10-CM | POA: Diagnosis not present

## 2016-02-18 DIAGNOSIS — K8020 Calculus of gallbladder without cholecystitis without obstruction: Secondary | ICD-10-CM | POA: Diagnosis present

## 2016-02-18 HISTORY — DX: Anemia, unspecified: D64.9

## 2016-02-18 HISTORY — DX: Personal history of other specified conditions: Z87.898

## 2016-02-18 HISTORY — DX: Essential (primary) hypertension: I10

## 2016-02-18 HISTORY — DX: Cardiac arrhythmia, unspecified: I49.9

## 2016-02-18 LAB — CBC
HEMATOCRIT: 46.3 % — AB (ref 36.0–46.0)
Hemoglobin: 15.3 g/dL — ABNORMAL HIGH (ref 12.0–15.0)
MCH: 30.4 pg (ref 26.0–34.0)
MCHC: 33 g/dL (ref 30.0–36.0)
MCV: 91.9 fL (ref 78.0–100.0)
PLATELETS: 219 10*3/uL (ref 150–400)
RBC: 5.04 MIL/uL (ref 3.87–5.11)
RDW: 12.6 % (ref 11.5–15.5)
WBC: 6.3 10*3/uL (ref 4.0–10.5)

## 2016-02-18 LAB — BASIC METABOLIC PANEL
Anion gap: 9 (ref 5–15)
BUN: 14 mg/dL (ref 6–20)
CALCIUM: 9.6 mg/dL (ref 8.9–10.3)
CO2: 27 mmol/L (ref 22–32)
CREATININE: 0.76 mg/dL (ref 0.44–1.00)
Chloride: 101 mmol/L (ref 101–111)
GFR calc Af Amer: >60 mL/min (ref >60)
GLUCOSE: 98 mg/dL (ref 65–99)
POTASSIUM: 3.2 mmol/L — AB (ref 3.5–5.1)
Sodium: 137 mmol/L (ref 135–145)

## 2016-02-18 NOTE — Progress Notes (Signed)
Noted red rash to upper arms bilateral. Pt denies itching or drainage. Pt states was biten by mosquitos on 02/03/2016.

## 2016-02-18 NOTE — Progress Notes (Signed)
BMP results in epic per PAT visit 02/18/2016 sent to Dr Evlyn Courier

## 2016-02-18 NOTE — H&P (Signed)
Ann Cain  Location: [facility] Surgery Patient #: [id number] DOB: Jul 27, 1971 Married / Language: English / Race: White Female   History of Present Illness The patient is a 45 year old female who presents for evaluation of gall stones. This is a pleasant patient referred by Dr. Kerry Hough for evaluation of symptomatic gallbladder sludge. She has been actually having attacks of abdominal pain with bloating and nausea and occasional diarrhea for several years. It is worse after fatty meals. She is finally had an ultrasound showing that her gallbladder is filled with sludge. There were no distinct stones or gallbladder wall thickening. The pain is moderate in intensity and sharp. Again, it is been occurring for several years. She is otherwise healthy without complaints.   Other Problems Asthma Gastroesophageal Reflux Disease Heart murmur Hemorrhoids High blood pressure Hypercholesterolemia Migraine Headache Thyroid Disease  Past Surgical History Cesarean Section - Multiple Colon Polyp Removal - Colonoscopy Hysterectomy (not due to cancer) - Complete Oral Surgery  Diagnostic Studies History Colonoscopy 1-5 years ago Mammogram within last year  Allergies  No Known Drug Allergies06/09/2015  Medication History  Allegra (60MG  Capsule, Oral) Active. HydroCHLOROthiazide (25MG  Tablet, Oral) Active. Omeprazole (40MG  Capsule DR, Oral) Active. Hydrocortisone Acetate (25MG  Suppository, Rectal) Active. ValACYclovir HCl (500MG  Tablet, Oral) Active. Tretinoin (0.025% Cream, External) Active. Levothyroxine Sodium (100MCG Tablet, Oral) Active. Albuterol Sulfate HFA (108MCG/ACT Aerosol Soln, Inhalation) Active. Medications Reconciled  Social History  Alcohol use Occasional alcohol use. Caffeine use Coffee. No drug use Tobacco use Never smoker.  Family History  Colon Cancer Family Members In General. Heart Disease Family Members In  General. Heart disease in female family member before age 58 Hypertension Family Members In General. Migraine Headache Family Members In General, Mother, Sister. Respiratory Condition Father.  Pregnancy / Birth History  Age at menarche 30 years. Contraceptive History Oral contraceptives. Gravida 2 Maternal age 12-40 Para 3    Review of Systems  General Not Present- Appetite Loss, Chills, Fatigue, Fever, Night Sweats, Weight Gain and Weight Loss. Skin Not Present- Change in Wart/Mole, Dryness, Hives, Jaundice, New Lesions, Non-Healing Wounds, Rash and Ulcer. HEENT Present- Seasonal Allergies and Sinus Pain. Not Present- Earache, Hearing Loss, Hoarseness, Nose Bleed, Oral Ulcers, Ringing in the Ears, Sore Throat, Visual Disturbances, Wears glasses/contact lenses and Yellow Eyes. Respiratory Not Present- Bloody sputum, Chronic Cough, Difficulty Breathing, Snoring and Wheezing. Breast Not Present- Breast Mass, Breast Pain, Nipple Discharge and Skin Changes. Cardiovascular Present- Palpitations. Not Present- Chest Pain, Difficulty Breathing Lying Down, Leg Cramps, Rapid Heart Rate, Shortness of Breath and Swelling of Extremities. Gastrointestinal Present- Bloating, Change in Bowel Habits, Constipation, Hemorrhoids, Indigestion and Rectal Pain. Not Present- Abdominal Pain, Bloody Stool, Chronic diarrhea, Difficulty Swallowing, Excessive gas, Gets full quickly at meals, Nausea and Vomiting. Female Genitourinary Not Present- Frequency, Nocturia, Painful Urination, Pelvic Pain and Urgency. Musculoskeletal Present- Joint Pain. Not Present- Back Pain, Joint Stiffness, Muscle Pain, Muscle Weakness and Swelling of Extremities. Neurological Present- Headaches and Tingling. Not Present- Decreased Memory, Fainting, Numbness, Seizures, Tremor, Trouble walking and Weakness. Psychiatric Not Present- Anxiety, Bipolar, Change in Sleep Pattern, Depression, Fearful and Frequent crying. Endocrine Not  Present- Cold Intolerance, Excessive Hunger, Hair Changes, Heat Intolerance, Hot flashes and New Diabetes. Hematology Not Present- Easy Bruising, Excessive bleeding, Gland problems, HIV and Persistent Infections.  Vitals   Weight: 174 lb Height: 70in Body Surface Area: 1.97 m Body Mass Index: 24.97 kg/m  Temp.: 98.70F(Temporal)  Pulse: 87 (Regular)  BP: 124/70 (Sitting, Left Arm, Standard)  Physical Exam  General Mental Status-Alert. General Appearance-Consistent with stated age. Hydration-Well hydrated. Voice-Normal.  Head and Neck Head-normocephalic, atraumatic with no lesions or palpable masses.  Eye Eyeball - Bilateral-Extraocular movements intact. Sclera/Conjunctiva - Bilateral-No scleral icterus.  Chest and Lung Exam Chest and lung exam reveals -quiet, even and easy respiratory effort with no use of accessory muscles and on auscultation, normal breath sounds, no adventitious sounds and normal vocal resonance. Inspection Chest Wall - Normal. Back - normal.  Cardiovascular Cardiovascular examination reveals -on palpation PMI is normal in location and amplitude, no palpable S3 or S4. Normal cardiac borders., normal heart sounds, regular rate and rhythm with no murmurs, carotid auscultation reveals no bruits and normal pedal pulses bilaterally.  Abdomen Inspection Inspection of the abdomen reveals - No Hernias. Skin - Scar - no surgical scars. Palpation/Percussion Palpation and Percussion of the abdomen reveal - Soft, No Rebound tenderness, No Rigidity (guarding) and No hepatosplenomegaly. Tenderness - Right Upper Quadrant. Auscultation Auscultation of the abdomen reveals - Bowel sounds normal.  Neurologic - Did not examine.  Musculoskeletal Normal Exam - Left-Upper Extremity Strength Normal and Lower Extremity Strength Normal. Normal Exam - Right-Upper Extremity Strength Normal, Lower Extremity Weakness.    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 I suspect she will have some chronic cholecystitis and small stones. I discussed surgery with her in detail. I gave her literature regarding surgery. I discussed the risks of surgery which includes but is not limited to bleeding, infection, bile duct injury, bile leak, injury to other structures, the need to convert to an open procedure, etc. I also discussed postoperative recovery. She understands and wished to proceed with surgery. Current Plans Pt Education - Pamphlet Given - Laparoscopic Gallbladder Surgery: discussed with patient and provided information.

## 2016-02-19 ENCOUNTER — Encounter (HOSPITAL_COMMUNITY): Payer: Self-pay | Admitting: *Deleted

## 2016-02-19 ENCOUNTER — Ambulatory Visit (HOSPITAL_COMMUNITY): Payer: Managed Care, Other (non HMO) | Admitting: Registered Nurse

## 2016-02-19 ENCOUNTER — Encounter (HOSPITAL_COMMUNITY)
Admission: RE | Disposition: A | Discharge status: Home / Self Care | Payer: Self-pay | Source: Ambulatory Visit | Attending: Surgery

## 2016-02-19 ENCOUNTER — Ambulatory Visit (HOSPITAL_COMMUNITY)
Admission: RE | Admit: 2016-02-19 | Discharge: 2016-02-19 | Disposition: A | Discharge status: Home / Self Care | Payer: Managed Care, Other (non HMO) | Source: Ambulatory Visit | Attending: Surgery | Admitting: Surgery

## 2016-02-19 DIAGNOSIS — K8010 Calculus of gallbladder with chronic cholecystitis without obstruction: Secondary | ICD-10-CM | POA: Diagnosis not present

## 2016-02-19 DIAGNOSIS — Z79899 Other long term (current) drug therapy: Secondary | ICD-10-CM | POA: Insufficient documentation

## 2016-02-19 DIAGNOSIS — J45909 Unspecified asthma, uncomplicated: Secondary | ICD-10-CM | POA: Insufficient documentation

## 2016-02-19 DIAGNOSIS — I10 Essential (primary) hypertension: Secondary | ICD-10-CM | POA: Insufficient documentation

## 2016-02-19 DIAGNOSIS — E039 Hypothyroidism, unspecified: Secondary | ICD-10-CM | POA: Insufficient documentation

## 2016-02-19 DIAGNOSIS — K219 Gastro-esophageal reflux disease without esophagitis: Secondary | ICD-10-CM | POA: Insufficient documentation

## 2016-02-19 HISTORY — PX: CHOLECYSTECTOMY: SHX55

## 2016-02-19 SURGERY — LAPAROSCOPIC CHOLECYSTECTOMY
Anesthesia: General

## 2016-02-19 MED ORDER — DEXAMETHASONE SODIUM PHOSPHATE 10 MG/ML IJ SOLN
INTRAMUSCULAR | Status: DC | PRN
  Administered 2016-02-19: 10 mg via INTRAVENOUS

## 2016-02-19 MED ORDER — PROPOFOL 10 MG/ML IV BOLUS
INTRAVENOUS | Status: DC | PRN
Start: 2016-02-19 — End: 2016-02-19
  Administered 2016-02-19: 150 mg via INTRAVENOUS

## 2016-02-19 MED ORDER — MORPHINE SULFATE (PF) 10 MG/ML IV SOLN: 1.0000 mg | INTRAVENOUS | Status: DC | PRN

## 2016-02-19 MED ORDER — FENTANYL CITRATE (PF) 250 MCG/5ML IJ SOLN
INTRAMUSCULAR | Status: AC
  Filled 2016-02-19: qty 5

## 2016-02-19 MED ORDER — KETOROLAC TROMETHAMINE 30 MG/ML IJ SOLN
INTRAMUSCULAR | Status: AC
  Filled 2016-02-19: qty 1

## 2016-02-19 MED ORDER — ROCURONIUM BROMIDE 100 MG/10ML IV SOLN
INTRAVENOUS | Status: DC | PRN
  Administered 2016-02-19: 30 mg via INTRAVENOUS
  Administered 2016-02-19: 5 mg via INTRAVENOUS

## 2016-02-19 MED ORDER — LACTATED RINGERS IR SOLN
Status: DC | PRN
  Administered 2016-02-19: 1000 mL

## 2016-02-19 MED ORDER — PROMETHAZINE HCL 25 MG/ML IJ SOLN: 6.2500 mg | INTRAMUSCULAR | Status: DC | PRN

## 2016-02-19 MED ORDER — DEXAMETHASONE SODIUM PHOSPHATE 10 MG/ML IJ SOLN
INTRAMUSCULAR | Status: AC
  Filled 2016-02-19: qty 1

## 2016-02-19 MED ORDER — FENTANYL CITRATE (PF) 100 MCG/2ML IJ SOLN
INTRAMUSCULAR | Status: DC | PRN
  Administered 2016-02-19: 50 ug via INTRAVENOUS
  Administered 2016-02-19: 100 ug via INTRAVENOUS
  Administered 2016-02-19: 50 ug via INTRAVENOUS

## 2016-02-19 MED ORDER — FENTANYL CITRATE (PF) 100 MCG/2ML IJ SOLN
INTRAMUSCULAR | Status: AC
  Filled 2016-02-19: qty 2

## 2016-02-19 MED ORDER — LACTATED RINGERS IV SOLN
INTRAVENOUS | Status: DC | PRN
  Administered 2016-02-19: 07:00:00 via INTRAVENOUS

## 2016-02-19 MED ORDER — SUGAMMADEX SODIUM 200 MG/2ML IV SOLN
INTRAVENOUS | Status: DC | PRN
  Administered 2016-02-19: 160 mg via INTRAVENOUS

## 2016-02-19 MED ORDER — LIDOCAINE HCL (CARDIAC) 20 MG/ML IV SOLN
INTRAVENOUS | Status: DC | PRN
  Administered 2016-02-19: 100 mg via INTRAVENOUS

## 2016-02-19 MED ORDER — MIDAZOLAM HCL 2 MG/2ML IJ SOLN
INTRAMUSCULAR | Status: AC
  Filled 2016-02-19: qty 2

## 2016-02-19 MED ORDER — ROCURONIUM BROMIDE 100 MG/10ML IV SOLN
INTRAVENOUS | Status: AC
  Filled 2016-02-19: qty 1

## 2016-02-19 MED ORDER — CEFAZOLIN SODIUM-DEXTROSE 2-4 GM/100ML-% IV SOLN
INTRAVENOUS | Status: AC
  Filled 2016-02-19: qty 100

## 2016-02-19 MED ORDER — PROPOFOL 10 MG/ML IV BOLUS
INTRAVENOUS | Status: AC
Start: 2016-02-19 — End: 2016-02-19
  Filled 2016-02-19: qty 20

## 2016-02-19 MED ORDER — ACETAMINOPHEN 650 MG RE SUPP: 650.0000 mg | RECTAL | Status: DC | PRN

## 2016-02-19 MED ORDER — SUCCINYLCHOLINE CHLORIDE 20 MG/ML IJ SOLN
INTRAMUSCULAR | Status: DC | PRN
  Administered 2016-02-19: 100 mg via INTRAVENOUS

## 2016-02-19 MED ORDER — BUPIVACAINE HCL (PF) 0.5 % IJ SOLN
INTRAMUSCULAR | Status: AC
  Filled 2016-02-19: qty 30

## 2016-02-19 MED ORDER — SUGAMMADEX SODIUM 200 MG/2ML IV SOLN
INTRAVENOUS | Status: AC
  Filled 2016-02-19: qty 2

## 2016-02-19 MED ORDER — SODIUM CHLORIDE 0.9 % IV SOLN: 250.0000 mL | INTRAVENOUS | Status: DC | PRN

## 2016-02-19 MED ORDER — 0.9 % SODIUM CHLORIDE (POUR BTL) OPTIME
TOPICAL | Status: DC | PRN
  Administered 2016-02-19: 1000 mL

## 2016-02-19 MED ORDER — BUPIVACAINE HCL (PF) 0.5 % IJ SOLN
INTRAMUSCULAR | Status: DC | PRN
  Administered 2016-02-19: 20 mL

## 2016-02-19 MED ORDER — MIDAZOLAM HCL 5 MG/5ML IJ SOLN
INTRAMUSCULAR | Status: DC | PRN
  Administered 2016-02-19: 2 mg via INTRAVENOUS

## 2016-02-19 MED ORDER — LACTATED RINGERS IV SOLN: INTRAVENOUS | Status: DC

## 2016-02-19 MED ORDER — SODIUM CHLORIDE 0.9% FLUSH: 3.0000 mL | INTRAVENOUS | Status: DC | PRN

## 2016-02-19 MED ORDER — OXYCODONE HCL 5 MG PO TABS: 5.0000 mg | ORAL_TABLET | ORAL | Status: DC | PRN

## 2016-02-19 MED ORDER — SODIUM CHLORIDE 0.9% FLUSH: 3.0000 mL | Freq: Two times a day (BID) | INTRAVENOUS | Status: DC

## 2016-02-19 MED ORDER — ACETAMINOPHEN 325 MG PO TABS: 650.0000 mg | ORAL_TABLET | ORAL | Status: DC | PRN

## 2016-02-19 MED ORDER — KETOROLAC TROMETHAMINE 30 MG/ML IJ SOLN
INTRAMUSCULAR | Status: DC | PRN
Start: 2016-02-19 — End: 2016-02-19
  Administered 2016-02-19: 30 mg via INTRAVENOUS

## 2016-02-19 MED ORDER — HYDROCODONE-ACETAMINOPHEN 5-325 MG PO TABS: 1.0000 | ORAL_TABLET | ORAL | Status: DC | PRN

## 2016-02-19 MED ORDER — CEFAZOLIN SODIUM-DEXTROSE 2-4 GM/100ML-% IV SOLN
2.0000 g | INTRAVENOUS | Status: AC
  Administered 2016-02-19: 2 g via INTRAVENOUS
  Filled 2016-02-19: qty 100

## 2016-02-19 MED ORDER — LIDOCAINE HCL (CARDIAC) 20 MG/ML IV SOLN
INTRAVENOUS | Status: AC
  Filled 2016-02-19: qty 5

## 2016-02-19 MED ORDER — FENTANYL CITRATE (PF) 100 MCG/2ML IJ SOLN
25.0000 ug | INTRAMUSCULAR | Status: DC | PRN
  Administered 2016-02-19: 50 ug via INTRAVENOUS

## 2016-02-19 MED ORDER — ONDANSETRON HCL 4 MG/2ML IJ SOLN
INTRAMUSCULAR | Status: AC
  Filled 2016-02-19: qty 2

## 2016-02-19 SURGICAL SUPPLY — 33 items
APPLIER CLIP 5 13 M/L LIGAMAX5 (MISCELLANEOUS) ×3
BANDAGE ADH SHEER 1  50/CT (GAUZE/BANDAGES/DRESSINGS) IMPLANT
BENZOIN TINCTURE PRP APPL 2/3 (GAUZE/BANDAGES/DRESSINGS) IMPLANT
CHLORAPREP W/TINT 26ML (MISCELLANEOUS) ×3 IMPLANT
CLIP APPLIE 5 13 M/L LIGAMAX5 (MISCELLANEOUS) ×1 IMPLANT
CLOSURE WOUND 1/2 X4 (GAUZE/BANDAGES/DRESSINGS)
COVER MAYO STAND STRL (DRAPES) IMPLANT
COVER SURGICAL LIGHT HANDLE (MISCELLANEOUS) ×3 IMPLANT
DECANTER SPIKE VIAL GLASS SM (MISCELLANEOUS) IMPLANT
DRAPE C-ARM 42X120 X-RAY (DRAPES) IMPLANT
DRAPE LAPAROSCOPIC ABDOMINAL (DRAPES) ×3 IMPLANT
ELECT REM PT RETURN 9FT ADLT (ELECTROSURGICAL) ×3
ELECTRODE REM PT RTRN 9FT ADLT (ELECTROSURGICAL) ×1 IMPLANT
GLOVE SURG SIGNA 7.5 PF LTX (GLOVE) ×3 IMPLANT
GOWN STRL REUS W/TWL XL LVL3 (GOWN DISPOSABLE) ×6 IMPLANT
HEMOSTAT SURGICEL 4X8 (HEMOSTASIS) IMPLANT
IRRIG SUCT STRYKERFLOW 2 WTIP (MISCELLANEOUS) ×3
IRRIGATION SUCT STRKRFLW 2 WTP (MISCELLANEOUS) ×1 IMPLANT
KIT BASIN OR (CUSTOM PROCEDURE TRAY) ×3 IMPLANT
LIQUID BAND (GAUZE/BANDAGES/DRESSINGS) ×3 IMPLANT
PAD POSITIONING PINK XL (MISCELLANEOUS) IMPLANT
POSITIONER SURGICAL ARM (MISCELLANEOUS) IMPLANT
POUCH SPECIMEN RETRIEVAL 10MM (ENDOMECHANICALS) ×3 IMPLANT
SET CHOLANGIOGRAPH MIX (MISCELLANEOUS) IMPLANT
STRIP CLOSURE SKIN 1/2X4 (GAUZE/BANDAGES/DRESSINGS) IMPLANT
SUT MNCRL AB 4-0 PS2 18 (SUTURE) ×3 IMPLANT
TAPE CLOTH 4X10 WHT NS (GAUZE/BANDAGES/DRESSINGS) IMPLANT
TOWEL OR 17X26 10 PK STRL BLUE (TOWEL DISPOSABLE) ×3 IMPLANT
TRAY LAPAROSCOPIC (CUSTOM PROCEDURE TRAY) ×3 IMPLANT
TROCAR BLADELESS OPT 5 75 (ENDOMECHANICALS) ×3 IMPLANT
TROCAR SLEEVE XCEL 5X75 (ENDOMECHANICALS) ×6 IMPLANT
TROCAR XCEL BLUNT TIP 100MML (ENDOMECHANICALS) ×3 IMPLANT
TUBING INSUF HEATED (TUBING) IMPLANT

## 2016-02-19 NOTE — Interval H&P Note (Signed)
History and Physical Interval Note:no change in H and P  02/19/2016 7:43 AM  Ann Cain  has presented today for surgery, with the diagnosis of symptomatic gallbladder sludge  The various methods of treatment have been discussed with the patient and family. After consideration of risks, benefits and other options for treatment, the patient has consented to  Procedure(s): LAPAROSCOPIC CHOLECYSTECTOMY (N/A) as a surgical intervention .  The patient's history has been reviewed, patient examined, no change in status, stable for surgery.  I have reviewed the patient's chart and labs.  Questions were answered to the patient's satisfaction.     [name]

## 2016-02-19 NOTE — Anesthesia Preprocedure Evaluation (Addendum)
Anesthesia Evaluation  Patient identified by MRN, date of birth, ID band Patient awake    Reviewed: Allergy & Precautions, NPO status , Patient's Chart, lab work & pertinent test results  Airway Mallampati: II  TM Distance: >3 FB Neck ROM: Full    Dental  (+) Teeth Intact, Dental Advisory Given   Pulmonary asthma ,    Pulmonary exam normal breath sounds clear to auscultation       Cardiovascular hypertension, Pt. on medications Normal cardiovascular exam Rhythm:Regular Rate:Normal  Study Conclusions  - Left ventricle: The cavity size was normal. Systolic function was normal. The estimated ejection fraction was in the range of 55% to 60%. Wall motion was normal; there were no regional wall motion abnormalities. Left ventricular diastolic function parameters were normal. - Aortic valve: Transvalvular velocity was within the normal range.  There was no stenosis. There was no regurgitation. - Mitral valve: Transvalvular velocity was within the normal range. There was no evidence for stenosis. - Right ventricle: The cavity size was normal. Wall thickness was normal. Systolic function was normal. - Atrial septum: No defect or patent foramen ovale was identified by color flow Doppler. - Tricuspid valve: There was trivial regurgitation. - Pulmonary arteries: Systolic pressure was within the normal range. PA peak pressure: 17 mm Hg (S).   Neuro/Psych  Headaches,    GI/Hepatic Neg liver ROS, GERD  Medicated,  Endo/Other  negative endocrine ROSHypothyroidism   Renal/GU negative Renal ROS     Musculoskeletal negative musculoskeletal ROS (+)   Abdominal   Peds  Hematology negative hematology ROS (+)   Anesthesia Other Findings Day of surgery medications reviewed with the patient.  Reproductive/Obstetrics                          Anesthesia Physical Anesthesia Plan  ASA: II  Anesthesia Plan: General    Post-op Pain Management:    Induction: Intravenous  Airway Management Planned: Oral ETT  Additional Equipment:   Intra-op Plan:   Post-operative Plan: Extubation in OR  Informed Consent: I have reviewed the patients History and Physical, chart, labs and discussed the procedure including the risks, benefits and alternatives for the proposed anesthesia with the patient or authorized representative who has indicated his/her understanding and acceptance.   Dental advisory given  Plan Discussed with: CRNA  Anesthesia Plan Comments: (Risks/benefits of general anesthesia discussed with patient including risk of damage to teeth, lips, gum, and tongue, nausea/vomiting, allergic reactions to medications, and the possibility of heart attack, stroke and death.  All patient questions answered.  Patient wishes to proceed.)        Anesthesia Quick Evaluation

## 2016-02-19 NOTE — Transfer of Care (Signed)
Immediate Anesthesia Transfer of Care Note  Patient: Ann Cain  Procedure(s) Performed: Procedure(s): LAPAROSCOPIC CHOLECYSTECTOMY (N/A)  Patient Location: PACU  Anesthesia Type:General  Level of Consciousness: awake, alert , oriented and patient cooperative  Airway & Oxygen Therapy: Patient Spontanous Breathing and Patient connected to face mask oxygen  Post-op Assessment: Report given to RN, Post -op Vital signs reviewed and stable and Patient moving all extremities  Post vital signs: Reviewed and stable  Last Vitals:  Filed Vitals:   02/19/16 0645  BP: 130/98  Pulse: 81  Temp: 37.1 C  Resp: 16    Last Pain: There were no vitals filed for this visit.    Patients Stated Pain Goal: 4 ([id number])  Complications: No apparent anesthesia complications

## 2016-02-19 NOTE — Anesthesia Postprocedure Evaluation (Signed)
Anesthesia Post Note  Patient: Ann Cain  Procedure(s) Performed: Procedure(s) (LRB): LAPAROSCOPIC CHOLECYSTECTOMY (N/A)  Patient location during evaluation: PACU Anesthesia Type: General Level of consciousness: awake and alert Pain management: pain level controlled Vital Signs Assessment: post-procedure vital signs reviewed and stable Respiratory status: spontaneous breathing, nonlabored ventilation, respiratory function stable and patient connected to nasal cannula oxygen Cardiovascular status: blood pressure returned to baseline and stable Postop Assessment: no signs of nausea or vomiting Anesthetic complications: no    Last Vitals:  Filed Vitals:   02/19/16 1012 02/19/16 1058  BP: 119/83 125/76  Pulse: 65 72  Temp: 37.1 C 36.3 C  Resp: 15 16    Last Pain: There were no vitals filed for this visit.               Catalina Gravel

## 2016-02-19 NOTE — Progress Notes (Signed)
Asst up to void. Tolerated wel . Denies dizziness

## 2016-02-19 NOTE — Anesthesia Procedure Notes (Signed)
Procedure Name: Intubation Date/Time: 02/19/2016 8:12 AM Performed by: Carleene Cooper A Pre-anesthesia Checklist: Patient identified, Timeout performed, Emergency Drugs available, Suction available and Patient being monitored Patient Re-evaluated:Patient Re-evaluated prior to inductionOxygen Delivery Method: Circle system utilized Preoxygenation: Pre-oxygenation with 100% oxygen Intubation Type: IV induction Ventilation: Mask ventilation without difficulty Laryngoscope Size: Mac and 4 Grade View: Grade I Tube type: Oral Tube size: 7.5 mm Number of attempts: 2 Airway Equipment and Method: Stylet Placement Confirmation: ETT inserted through vocal cords under direct vision,  breath sounds checked- equal and bilateral and positive ETCO2 Secured at: 21 cm Tube secured with: Tape Dental Injury: Teeth and Oropharynx as per pre-operative assessment

## 2016-02-19 NOTE — Op Note (Signed)

## 2016-02-19 NOTE — Discharge Instructions (Signed)
[facility] ______[facility] °LAPAROSCOPIC SURGERY: POST OP INSTRUCTIONS °Always review your discharge instruction sheet given to you by the facility where your surgery was performed. °IF YOU HAVE DISABILITY OR FAMILY LEAVE FORMS, YOU MUST BRING THEM TO THE OFFICE FOR PROCESSING.   °DO NOT GIVE THEM TO YOUR DOCTOR. ° °1. A prescription for pain medication may be given to you upon discharge.  Take your pain medication as prescribed, if needed.  If narcotic pain medicine is not needed, then you may take acetaminophen (Tylenol) or ibuprofen (Advil) as needed. °2. Take your usually prescribed medications unless otherwise directed. °3. If you need a refill on your pain medication, please contact your pharmacy.  They will contact our office to request authorization. Prescriptions will not be filled after 5pm or on week-ends. °4. You should follow a light diet the first few days after arrival home, such as soup and crackers, etc.  Be sure to include lots of fluids daily. °5. Most patients will experience some swelling and bruising in the area of the incisions.  Ice packs will help.  Swelling and bruising can take several days to resolve.  °6. It is common to experience some constipation if taking pain medication after surgery.  Increasing fluid intake and taking a stool softener (such as Colace) will usually help or prevent this problem from occurring.  A mild laxative (Milk of Magnesia or Miralax) should be taken according to package instructions if there are no bowel movements after 48 hours. °7. Unless discharge instructions indicate otherwise, you may remove your bandages 24-48 hours after surgery, and you may shower at that time.  You may have steri-strips (small skin tapes) in place directly over the incision.  These strips should be left on the skin for 7-10 days.  If your surgeon used skin glue on the incision, you may shower in 24 hours.  The glue will flake off over the next 2-3 weeks.  Any sutures or  staples will be removed at the office during your follow-up visit. °8. ACTIVITIES:  You may resume regular (light) daily activities beginning the next day--such as daily self-care, walking, climbing stairs--gradually increasing activities as tolerated.  You may have sexual intercourse when it is comfortable.  Refrain from any heavy lifting or straining until approved by your doctor. °a. You may drive when you are no longer taking prescription pain medication, you can comfortably wear a seatbelt, and you can safely maneuver your car and apply brakes. °b. RETURN TO WORK:  __________________________________________________________ °9. You should see your doctor in the office for a follow-up appointment approximately 2-3 weeks after your surgery.  Make sure that you call for this appointment within a day or two after you arrive home to insure a convenient appointment time. °10. OTHER INSTRUCTIONS: __________________________________________________________________________________________________________________________ __________________________________________________________________________________________________________________________ °WHEN TO CALL YOUR DOCTOR: °1. Fever over 101.0 °2. Inability to urinate °3. Continued bleeding from incision. °4. Increased pain, redness, or drainage from the incision. °5. Increasing abdominal pain ° °The clinic staff is available to answer your questions during regular business hours.  Please don’t hesitate to call and ask to speak to one of the nurses for clinical concerns.  If you have a medical emergency, go to the nearest emergency room or call 911.  A surgeon from [facility] is always on call at the hospital. °[address], [address], [place], [place]  [address] ? [address] [address], [place],    [address] °[phone] ? [phone] ? FAX [phone] °Web site:   [email or web address]   General Anesthesia, Adult, Care After Refer to this  sheet in the next few weeks. These instructions provide you with information on caring for yourself after your procedure. Your health care provider may also give you more specific instructions. Your treatment has been planned according to current medical practices, but problems sometimes occur. Call your health care provider if you have any problems or questions after your procedure. WHAT TO EXPECT AFTER THE PROCEDURE After the procedure, it is typical to experience:  Sleepiness.  Nausea and vomiting. HOME CARE INSTRUCTIONS  For the first 24 hours after general anesthesia:  Have a responsible person with you.  Do not drive a car. If you are alone, do not take public transportation.  Do not drink alcohol.  Do not take medicine that has not been prescribed by your health care provider.  Do not sign important papers or make important decisions.  You may resume a normal diet and activities as directed by your health care provider.  If you have questions or problems that seem related to general anesthesia, call the hospital and ask for the anesthetist or anesthesiologist on call. SEEK MEDICAL CARE IF:  You have nausea and vomiting that continue the day after anesthesia.  You develop a rash. SEEK IMMEDIATE MEDICAL CARE IF:   You have difficulty breathing.  You have chest pain.  You have any allergic problems.   This information is not intended to replace advice given to you by your health care provider. Make sure you discuss any questions you have with your health care provider.   Document Released: 10/25/2000 Document Revised: 08/09/2014 Document Reviewed: 11/17/2011 Elsevier Interactive Patient Education [employer].

## 2016-02-20 ENCOUNTER — Encounter (HOSPITAL_COMMUNITY): Payer: Self-pay | Admitting: Surgery

## 2016-03-04 ENCOUNTER — Encounter: Payer: Self-pay | Admitting: Cardiology

## 2016-03-04 ENCOUNTER — Ambulatory Visit (INDEPENDENT_AMBULATORY_CARE_PROVIDER_SITE_OTHER): Payer: Managed Care, Other (non HMO) | Admitting: Cardiology

## 2016-03-04 VITALS — BP 128/86 | HR 74 | Ht 70.0 in | Wt 162.2 lb

## 2016-03-04 DIAGNOSIS — R002 Palpitations: Secondary | ICD-10-CM | POA: Diagnosis not present

## 2016-03-04 DIAGNOSIS — R011 Cardiac murmur, unspecified: Secondary | ICD-10-CM

## 2016-03-04 DIAGNOSIS — R0789 Other chest pain: Secondary | ICD-10-CM

## 2016-03-04 NOTE — Patient Instructions (Signed)
Your physician recommends that you schedule a follow-up appointment in: AS NEEDED  

## 2016-03-04 NOTE — Progress Notes (Signed)
PCP: Howard Pouch, DO  Clinic Note: Chief Complaint  Patient presents with  . Follow-up    per echo; pt complains of no Sx  . Palpitations    HPI: Ann Cain is a 45 y.o. female with a PMH -- hypertension, elevated A1c level below who returns after initial consultation for preoperative evaluation back in June. I saw her on June 20 for complaints of some heart fluttering and some chest discomfort. She felt these all related to her gallbladder issues. She was actually requesting preoperative evaluation for cholecystectomy. She was evaluated with a GXT, echocardiogram and event monitor.  Studies Reviewed: n/a  GXT/ETT 01/23/2016: She reached 10.10 METS  Hypertensive response to exercise. No ST deviation. No T-wave inversion. Baseline ST depressions in inferolateral leads remain the same distress.  NEGATIVE GXT  Echocardiogram 01/21/2016: EF 55-60% and no regional wall motion modalities. No PFO. Normal valves.  Event monitor June 20-July 19: Normal study. Sinus rhythm with artifact. No PACs, PVCs or any arrhythmia.  Interval History: He presents here today now actually feeling a lot better since she has had her cholecystectomy. She has not yet been able to get back to regular diet however. When she tried either normal meal of rice and steamed vegetables earlier this week, she noted discomfort in her abdomen and noted significant palpitations. Otherwise she has not had any increase in heart rate. She said that after eating her heart rate up into the 90s. Not like she had before however. She has had to adjust her diet as a result of her gallbladder issues, and thinks it this may partly why she is not having as many symptoms. She is really otherwise doing pretty well from a heart standpoint she's not had any syncope or near syncope. No dizziness or wooziness. No TIA/amaurosis fugax.  She denies any other episodes of chest tightness or pressure with rest or exertion. T She denies any PND,  orthopnea or edema. Has not had any near syncope or syncope, TIA or murmurs fugax. She has had some nausea without vomiting and no epistaxis or hematemesis.  No claudication.  ROS: A comprehensive was performed. Review of Systems  Constitutional: Negative for chills and fever.  Respiratory: Negative for cough, shortness of breath and wheezing.   Cardiovascular: Negative for chest pain (not since late May -- post-prandial), palpitations (Notably improved overall as indicated above), orthopnea, claudication, leg swelling and PND.  Gastrointestinal: Positive for abdominal pain and nausea (5/6 - no emesis.  Minimal since then). Negative for blood in stool (seeing GI for ? anal dissue - using cream) and melena.       See history of present illness. Still not back to eating baseline  Genitourinary: Negative for hematuria.  Musculoskeletal: Negative.        Better with change in diet  Neurological: Positive for tingling (hands) and headaches (@ night - getting better; intermittent Migraine HA). Negative for loss of consciousness.  Psychiatric/Behavioral: Negative for depression and memory loss. The patient is nervous/anxious (Much less anxious now that her GI issues are resolved.). The patient does not have insomnia (had difficulty sleeping in May with HA & plapitations).   All other systems reviewed and are negative.   Past Medical History:  Diagnosis Date  . Acid reflux 2012   egd  . Allergic rhinitis    [place] allergy/asthma patient  . Allergy   . Anal fissure   . Anemia    prior to hysterectomy   . Asthma   .  Colon polyp 2015   tubular adenoma  . Dysrhythmia   . Fibroid uterus   . 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 Hemorrhoid   . History of bronchitis   . History of chicken pox   . History of dizziness   . Hypertension   . Hypothyroidism   . Migraines   . Rectal bleeding     Past Surgical History:  Procedure Laterality Date  . 48-Hour Cardiac Event Monitor  01/2016   Normal  study. Sinus rhythm with artifact. No PACs, PVCs or arrhythmias.  . ABDOMINAL HYSTERECTOMY    . CESAREAN SECTION  2002, 2004  . CHOLECYSTECTOMY N/A 02/19/2016   Procedure: LAPAROSCOPIC CHOLECYSTECTOMY;  Surgeon: Coralie Keens, MD;  Location: [facility];  Service: General;  Laterality: N/A;  . COLONOSCOPY  2015   Dr. Claudie Leach internal hemorrhoids, 2 polyps  . DENTAL SURGERY    . TRANSTHORACIC ECHOCARDIOGRAM  01/21/2016   EF 5560%. No RWMA. Normal Valves. No MVP. No PFO/ASD  . Treadmill Stress Test  01/27/2016   Reached 10.10 METS  Hypertensive response to exercise. No ST deviation. No T-wave inversion. Baseline ST depressions in inferolateral leads remain the same distress.  NEGATIVE GXT    Prior to Admission medications   Medication Sig Start Date End Date Taking? Authorizing Provider  acetaminophen (TYLENOL) 500 MG tablet Take 500 mg by mouth every 6 (six) hours as needed for headache.   Yes Historical Provider, MD  Albuterol Sulfate (PROAIR RESPICLICK) 123XX123 (90 Base) MCG/ACT AEPB Inhale 2 puffs into the lungs every 6 (six) hours as needed (wheezing).  06/06/15  Yes Historical Provider, MD  Cod Liver Oil OIL Take 5 mLs by mouth daily. Artic D- Cod Liver Oil.   Yes Historical Provider, MD  fexofenadine (ALLEGRA) 180 MG tablet Take 180 mg by mouth at bedtime.   Yes Historical Provider, MD  hydrochlorothiazide (HYDRODIURIL) 25 MG tablet Take 1 tablet (25 mg total) by mouth daily. Patient taking differently: Take 50 mg by mouth 2 (two) times daily.  01/16/16  Yes Renee A Kuneff, DO  HYDROcodone-acetaminophen (NORCO) 5-325 MG tablet Take 1-2 tablets by mouth every 4 (four) hours as needed for moderate pain. 02/19/16  Yes Coralie Keens, MD  IRON PO Take 1 tablet by mouth daily.   Yes Historical Provider, MD  levothyroxine (SYNTHROID, LEVOTHROID) 100 MCG tablet Take 100 mcg by mouth daily before breakfast.  05/14/15  Yes Historical Provider, MD  Multiple Vitamin (MULTIVITAMIN WITH MINERALS) TABS  tablet Take 1 tablet by mouth daily.   Yes Historical Provider, MD  tretinoin (RETIN-A) 0.025 % cream APPLY 1 APPLICATION A PEARL-SIZED AMOUNT TO FACE IN THE EVENING ONCE A DAY 10/21/15  Yes Historical Provider, MD  valACYclovir (VALTREX) 500 MG tablet TAKE 1 TABLET BY MOUTH DAILY AS PREVENTION, INCREASE TO TWICE A DAY FOR 3 DAYS WITH LESIONS 10/22/15  Yes Historical Provider, MD    No Known Allergies   Social History   Social History  . Marital status: Married    Spouse name: N/A  . Number of children: N/A  . Years of education: N/A   Social History Main Topics  . Smoking status: Never Smoker  . Smokeless tobacco: Never Used  . Alcohol use No  . Drug use: No  . Sexual activity: Yes    Birth control/ protection: None   Other Topics Concern  . None   Social History Narrative   Married to [place]. 3 children (one set of twins). 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  [name], [name].   College education. Masters degree, stay-at-home mother.   No tobacco use, no recreational drugs, occasional alcohol.   Drinks caffeinated beverages, takes a daily vitamin   Wears her seatbelt and bicycle helmet   Smoke detector in the home    Feels safe in her relationship.    Family History  Problem Relation Age of Onset  . Kidney cancer Father 65  . Lung cancer Maternal Grandfather   . Colon cancer Paternal Grandmother 44  . Heart disease Paternal Grandmother   . Heart disease Paternal Grandfather     Wt Readings from Last 3 Encounters:  03/04/16 162 lb 3.2 oz (73.6 kg)  02/19/16 162 lb (73.5 kg)  02/18/16 162 lb 4 oz (73.6 kg)    PHYSICAL EXAM BP 128/86   Pulse 74   Ht 5\' 10"  (1.778 m)   Wt 162 lb 3.2 oz (73.6 kg)   BMI 23.27 kg/m  General appearance: alert, cooperative, appears stated age, no distress and Well-nourished, well-groomed. Healthy-appearing. Somewhat anxious however. Normal mood and affect. Neck: no adenopathy, no carotid bruit and no JVD Lungs: clear to auscultation  bilaterally, normal percussion bilaterally and non-labored Heart: regular rate and rhythm, S1 & S2 normal, no obvious murmur (however cannot exclude soft SEM) , click, rub or gallop; nondisplaced PMI Abdomen: soft, non-tender; bowel sounds normal; no masses,  no organomegaly; no HJR -- a little tenderness in the right upper quadrant in the postoperative area. But otherwise really stable. Extremities: extremities normal, atraumatic, no cyanosis,Pulses: 2+ and symmetric;  Neurologic: Mental status: Alert, oriented, thought content appropriate    Adult ECG Report  Rate: 74 ;  Rhythm: normal sinus rhythm and Axis (-12) - borderline LAFB. Incomplete RBBB, nonspecific T-wave abnormalities. Normal QTc  Narrative Interpretation: Mildly abnormal EKG, but no acute findings. Axis is slightly less leftward  Other studies Reviewed: Additional studies/ records that were reviewed today include:  Recent Labs:  PCP - concerned re: HTN, HLD & high CBG (Metabolic Syndrome) - now on diet, plan recheck in 57month   Lab Results  Component Value Date   CHOL 231 (H) 12/15/2015   HDL 42.40 12/15/2015   LDLCALC 163 (H) 12/15/2015   TRIG 126.0 12/15/2015   CHOLHDL 5 12/15/2015   Lab Results  Component Value Date   TSH 1.75 12/15/2015   Lab Results  Component Value Date   HGB 15.3 (H) 02/18/2016    ASSESSMENT / PLAN: Problem List Items Addressed This Visit    Palpitations - Primary    Normal event monitor findings.  No arrhythmias, PACs or PVCs.  Thankfully, she is not really having that many of the symptoms now. That may be why we didn't see anything, just she really wasn't noticing any symptoms at the time. I think if she has some more symptoms in the future, maybe we can capture that 3 or able to get a monitor on her. But for now I think we can just monitor her, with return follow-up as necessary.      Relevant Orders   EKG 12-Lead (Completed)   Pain in the chest    She did pretty well with her  GXT. Unlikely to be an ischemic etiology based on the fact of her doing as well as she did have a stress test.      Murmur    No obvious evidence of anything to cause a murmur on her echocardiogram. This would correlate well with that being most likely consistent with a  flow murmur.       Other Visit Diagnoses   None.    Current medicines are reviewed at length with the patient today. (+/- concerns) none The following changes have been made: none  Studies Ordered:   Orders Placed This Encounter  Procedures  . EKG 12-Lead   Follow-up when necessary Contact the office if there are any recurrent symptoms of palpitations.   Glenetta Hew, M.D., M.S. Interventional Cardiologist   Pager # [phone] Phone # [phone] [address] [place], [place] [address]

## 2016-03-05 ENCOUNTER — Encounter: Payer: Self-pay | Admitting: Cardiology

## 2016-03-05 NOTE — Assessment & Plan Note (Signed)
She did pretty well with her GXT. Unlikely to be an ischemic etiology based on the fact of her doing as well as she did have a stress test.

## 2016-03-05 NOTE — Assessment & Plan Note (Signed)
No obvious evidence of anything to cause a murmur on her echocardiogram. This would correlate well with that being most likely consistent with a flow murmur.

## 2016-03-05 NOTE — Assessment & Plan Note (Addendum)
Normal event monitor findings.  No arrhythmias, PACs or PVCs.  Thankfully, she is not really having that many of the symptoms now. That may be why we didn't see anything, just she really wasn't noticing any symptoms at the time. I think if she has some more symptoms in the future, maybe we can capture that 3 or able to get a monitor on her. But for now I think we can just monitor her, with return follow-up as necessary.

## 2016-03-23 ENCOUNTER — Ambulatory Visit: Payer: Managed Care, Other (non HMO) | Admitting: Family Medicine

## 2016-03-23 ENCOUNTER — Encounter: Payer: Self-pay | Admitting: Family Medicine

## 2016-03-23 ENCOUNTER — Telehealth: Payer: Self-pay | Admitting: Family Medicine

## 2016-03-23 ENCOUNTER — Ambulatory Visit (INDEPENDENT_AMBULATORY_CARE_PROVIDER_SITE_OTHER): Payer: Managed Care, Other (non HMO) | Admitting: Family Medicine

## 2016-03-23 VITALS — BP 124/83 | HR 59 | Temp 98.1°F | Resp 20 | Ht 70.0 in | Wt 162.2 lb

## 2016-03-23 DIAGNOSIS — Z79899 Other long term (current) drug therapy: Secondary | ICD-10-CM | POA: Diagnosis not present

## 2016-03-23 DIAGNOSIS — I10 Essential (primary) hypertension: Secondary | ICD-10-CM

## 2016-03-23 DIAGNOSIS — L309 Dermatitis, unspecified: Secondary | ICD-10-CM | POA: Insufficient documentation

## 2016-03-23 LAB — COMPREHENSIVE METABOLIC PANEL
ALBUMIN: 4.3 g/dL (ref 3.5–5.2)
ALK PHOS: 44 U/L (ref 39–117)
ALT: 15 U/L (ref 0–35)
AST: 15 U/L (ref 0–37)
BUN: 16 mg/dL (ref 6–23)
CO2: 30 mEq/L (ref 19–32)
Calcium: 9.4 mg/dL (ref 8.4–10.5)
Chloride: 102 mEq/L (ref 96–112)
Creatinine, Ser: 0.84 mg/dL (ref 0.40–1.20)
GFR: 78.51 mL/min (ref >60.00)
Glucose, Bld: 91 mg/dL (ref 70–99)
Potassium: 3.7 mEq/L (ref 3.5–5.1)
Sodium: 139 mEq/L (ref 135–145)
TOTAL PROTEIN: 6.9 g/dL (ref 6.0–8.3)
Total Bilirubin: 0.7 mg/dL (ref 0.2–1.2)

## 2016-03-23 MED ORDER — HYDROCHLOROTHIAZIDE 25 MG PO TABS: 50.0000 mg | ORAL_TABLET | Freq: Every day | ORAL | 1 refills | Status: DC

## 2016-03-23 NOTE — Progress Notes (Signed)
Patient ID: Ann Cain, female   DOB: 21-Aug-1970, 45 y.o.   MRN: [id number]      Patient ID: Ann Cain, female  DOB: [birth year], 45 y.o.   MRN: [id number]  Subjective:  Ann Cain is a 45 y.o. female present for follow up on HTN. With new rash.  HTN: Patient reports compliance with medications today. She is tolerating HCTZ BID. Blood pressure is normal today. Pt states she is feeling better. She is monitoring the salt content in her diet. She is exercising at least 30 minutes day. She has started cod liver oil. She also has been evaluated by cardiology for palpitations, with negative work up. She has had her GB removed as well.     Rash: pt states she has had a fine itchy rash start all over her arms, legs and chest. She believes it started after exposure to "off" bug spray. She feels the rash is worse after sun exposure and hot showers. It has been present since late June. She saw a dermatologist and recently had a skin biopsy that pt states it was not lupus. She is uncertain of any other possible exposures. She was given kenalog cream by derm, but she admits she has not tried it.     Recent Results (from the past 2160 hour(s))  ECHOCARDIOGRAM COMPLETE     Status: Abnormal   Collection Time: 01/21/16  9:11 AM  Result Value Ref Range   LVIDD  cm   LVIDS  2.1 - 4.0 cm   IVS  0.6 - 1.1 cm   LV PW d 9.11 (A) 0.6 - 1.1 mm   FS 30 28 - 44 %   Calc EF  55 - 75 %   BSA  m2   Single Plane A4C EF  %   LA ID, A-P, ES 21 mm   LA diam end sys 21 mm   LA vol  mL   LA vol index  mL/m2   LV sys vol  mL   LV sys vol index  mL/m2   LV dias vol  46 - 106 mL   LV dias vol index  mL/m2   LV mass  44 - 88 g   LV Mass Index  g/m2   Simpson's disk     LV PW s  mm   IVS/LV PW RATIO, ED 1.05    Stroke v  ml   Cardiac Output  L/min   SV INDEX  mL/m2   Annulus  cm   Sinus  cm   AORTIC ROOT 2D  mm   STJ  cm   Ao-prox  cm   Ao-asc  cm   Ao-arch  cm   Ao-desc  cm   LVOT diameter 20 mm     MPA annulus  cm   MPA annulus  cm   Left pulmonary artery  cm   Right pulmonary artery  cm   Longitudinal strain  %   Radial strain  %   Circumferential strain  %   Right Vent Outflow VTI  cm   RVID apex  cm   RVOT area  cm2   RVID mid vent  cm   Right Ventricular Stroke Volume by Doppler Volume Outflow  mL   CRVID annulus  cm   LVOT VTI 22 cm   Reg peak vel 189 cm/s   LV e' LATERAL 14.9 cm/s   LV E/e' medial 4.6    LV E/e'average 4.6    LA diam  index 1.08 cm/m2   LA vol A4C 24 ml   E decel time 201 msec   LVOT area 3.14 cm2   LVOT peak vel 111 cm/s   LVOT SV 69 mL   E/e' ratio 4.6    MV pk E vel 68.6 m/s   TR max vel 189 cm/s   MV pk A vel 60.7 m/s   MV Dec 201    TDI e' medial 8.66    TDI e' lateral 14.9    Ao pk vel  m/s   VTI  cm   Valve area  cm2   Mean grad  mmHg   Peak grad  mmHg   Velocity Ratio     LVOT peak grad rest  mmHg   Exer mean grad  mmHg   Exer peak grad  mmHg   peak vel  m/s   exer area  cm2   AV Comp diameter  cm   AV Comp area  cm2   AV Comp VTI  cm   AV Comp SV  cm3   AV Incomp diameter  cm   AV Incomp area  cm2   AV Incomp VTI  cm   AV Incomp SV  cm3   Reg vol  cc   Reg frac  %   P 1/2 time  ms   Vena cont  cm   Nyquist  m/s   Radius  cm   AR max vel  m/s   PISA EROA  cm2   Peak grad Vals  mmHg   Peak grad Amyl  mmHg   Peak grad exer  mmHg   Mean grad  mmHg   Peak grad  mmHg   MV VTI  cm   Area-P 1/2  cm2   Area-2D  cm2   Area-cont eq  cm2   Area-PISA  cm2   Nyquist  m/s   PISA MS Radius  cm   Angle Cor  deg   Exer mean grad  mmHg   Exer area  cm2   Exer PAP  mmHg   MV Comp diameter  cm   MV Comp area  cm2   MV Comp VTI  cm   MV regurgitant SV 1  cm3   MV Incomp diameter  cm   MV Incomp area  cm2   MV Incomp VTI  cm   MV Incomp SV  cm3   Reg vol  cc   Reg frac  %   Vena cont  cm   Nyquist  m/s   Radius  cm   MV pk E vel  cm/s   PISA EROA  cm2   Mean grad  mmHg   Peak grad  mmHg   VTI  cm   P 1/2 time   ms   Area-P 1/2  cm2   Area-cont eq  cm2   Rest PAP  mmHg   Peak Stress PAP  mmHg   TV Comp Diameter  cm   TV Comp Area  cm2   TV Comp VTI  cm   TV Comp SV  cm3   TV Incomp Diameter  cm   TV Incomp Area  cm2   TV Incomp VTI  cm   TV Incomp SV  cm3   Reg vol  cc   Reg frac  %   Vena cont  cm   Nyquist  m/s   Radius  cm   PISA EROA  cm2   RVOT diamter  cm   RVOT pk vel  m/s   RVOT VTI  cm   Mean grad  mmHg   Peak grad  mmHg   Valve area  cm2   E/A ratio     IVRT  msec   Pulm vein S/D ratio     TDI e'     MV "A" dur  msec   Pulm AR dur  msec   PV Peak S Vel  m/s   PV Peak D Vel  m/s   Qp:Qs ratio     RVOT stroke volume  cm3  EXERCISE TOLERANCE TEST     Status: None   Collection Time: 01/23/16 11:30 AM  Result Value Ref Range   Rest HR 121 bpm   Rest BP 159/103 mmHg   Exercise duration (min) 8 min   Exercise duration (sec) 0 sec   Estimated workload 10.1 METS   Peak HR 176 bpm   Peak BP 179/99 mmHg   MPHR 177 bpm   Percent HR 99 %   RPE 15   Basic metabolic panel     Status: Abnormal   Collection Time: 02/18/16 12:20 PM  Result Value Ref Range   Sodium 137 135 - 145 mmol/L   Potassium 3.2 (L) 3.5 - 5.1 mmol/L   Chloride 101 101 - 111 mmol/L   CO2 27 22 - 32 mmol/L   Glucose, Bld 98 65 - 99 mg/dL   BUN 14 6 - 20 mg/dL   Creatinine, Ser 0.76 0.44 - 1.00 mg/dL   Calcium 9.6 8.9 - 10.3 mg/dL   GFR calc non Af Amer >60 >60 mL/min   GFR calc Af Amer >60 >60 mL/min    Comment: (NOTE) The eGFR has been calculated using the CKD EPI equation. This calculation has not been validated in all clinical situations. eGFR's persistently <60 mL/min signify possible Chronic Kidney Disease.    Anion gap 9 5 - 15  CBC     Status: Abnormal   Collection Time: 02/18/16 12:20 PM  Result Value Ref Range   WBC 6.3 4.0 - 10.5 K/uL   RBC 5.04 3.87 - 5.11 MIL/uL   Hemoglobin 15.3 (H) 12.0 - 15.0 g/dL   HCT 46.3 (H) 36.0 - 46.0 %   MCV 91.9 78.0 - 100.0 fL   MCH 30.4 26.0 -  34.0 pg   MCHC 33.0 30.0 - 36.0 g/dL   RDW 12.6 11.5 - 15.5 %   Platelets 219 150 - 400 K/uL     Past Medical History:  Diagnosis Date  . Acid reflux 2012   egd  . Allergic rhinitis    [place] allergy/asthma patient  . Allergy   . Anal fissure   . Anemia    prior to hysterectomy   . Asthma   . Colon polyp 2015   tubular adenoma  . Dysrhythmia   . Fibroid uterus   . 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 Hemorrhoid   . History of bronchitis   . History of chicken pox   . History of dizziness   . Hypertension   . Hypothyroidism   . Migraines   . Rectal bleeding    No Known Allergies Past Surgical History:  Procedure Laterality Date  . 48-Hour Cardiac Event Monitor  01/2016   Normal study. Sinus rhythm with artifact. No PACs, PVCs or arrhythmias.  . ABDOMINAL HYSTERECTOMY    . CESAREAN SECTION  2002, 2004  . CHOLECYSTECTOMY N/A 02/19/2016   Procedure: LAPAROSCOPIC  CHOLECYSTECTOMY;  Surgeon: Coralie Keens, MD;  Location: [facility];  Service: General;  Laterality: N/A;  . COLONOSCOPY  2015   Dr. Claudie Leach internal hemorrhoids, 2 polyps  . DENTAL SURGERY    . TRANSTHORACIC ECHOCARDIOGRAM  01/21/2016   EF 5560%. No RWMA. Normal Valves. No MVP. No PFO/ASD  . Treadmill Stress Test  01/27/2016   Reached 10.10 METS  Hypertensive response to exercise. No ST deviation. No T-wave inversion. Baseline ST depressions in inferolateral leads remain the same distress.  NEGATIVE GXT   Family History  Problem Relation Age of Onset  . Kidney cancer Father 63  . Lung cancer Maternal Grandfather   . Colon cancer Paternal Grandmother 93  . Heart disease Paternal Grandmother   . Heart disease Paternal Grandfather    Social History   Social History  . Marital status: Married    Spouse name: N/A  . Number of children: N/A  . Years of education: N/A   Occupational History  . Not on file.   Social History Main Topics  . Smoking status: Never Smoker  . Smokeless tobacco: Never Used  . Alcohol  use No  . Drug use: No  . Sexual activity: Yes    Birth control/ protection: None   Other Topics Concern  . Not on file   Social History Narrative   Married to [place]. 3 children (one set of twins). 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 [name], [name].   College education. Masters degree, stay-at-home mother.   No tobacco use, no recreational drugs, occasional alcohol.   Drinks caffeinated beverages, takes a daily vitamin   Wears her seatbelt and bicycle helmet   Smoke detector in the home    Feels safe in her relationship.   ROS: Negative, with the exception of above mentioned in HPI  Objective: BP 124/83 (BP Location: Right Arm, Patient Position: Sitting, Cuff Size: Normal)   Pulse (!) 59   Temp 98.1 F (36.7 C) (Oral)   Resp 20   Ht _0  (1.778 m)   Wt 162 lb 4 oz (73.6 kg)   SpO2 99%   BMI 23.28 kg/m  Gen: Afebrile. No acute distress. Nontoxic in appearance, well-developed, well-nourished, female, talkative   HENT: AT. [place]. Bilateral TM visualized and normal in appearance, normal external auditory canal. MMM, no oral lesions, Good dentition. Bilateral nares without erythema or swelling. Throat without erythema, ulcerations or exudates. No Cough on exam, No  hoarseness on exam. Eyes:Pupils Equal Round Reactive to light, Extraocular movements intact,  Conjunctiva without redness, discharge or icterus. Neck/lymp/endocrine: Supple,no lymphadenopathy, no thyromegaly CV: RRR, No edema, +2/4 P posterior tibialis pulses. Nocarotid bruits. No JVD. Chest: CTAB, no wheeze, rhonchi or crackles. Normal Respiratory effort. Good  Air movement. Abd: Soft. flat. ND. Mild RUQ discomfort TTP. BS present. No Masses palpated. No hepatosplenomegaly. No rebound tenderness or guarding. Skin: Fine raised red rash over forearms and chest. No   purpura or petechiae. Warm and well-perfused. Skin intact. Neuro/Msk: Normal gait. PERLA. EOMi. Alert. Oriented x3.  Cranial nerves II through XII intact. Muscle  strength 5/5 UE/LE extremity. DTRs equal bilaterally. Psych: Normal affect, dress and demeanor. Normal speech. Normal thought content and judgment.   Assessment/plan: Ann Cain is a 45 y.o. female present for HTN F/U: Essential hypertension - controlled today.  - Continue HCTZ 25 mg BID, refills prescribed.  - hydrochlorothiazide (HYDRODIURIL) 25 MG tablet; Take 1 tablet (25 mg total) by mouth daily.  Dispense: 90 tablet; Refill: 1 - pt to monitor at  home if routinely above 140/90 will need to see sooner.  - 3 month f/u--> lipid recheck with be due then as well.  - cmp  Dermatitis:  - Has seen Derm with bx completed, uncertain results besides pt states it was not lupus. Sounds has though it happened after exposure to bug spray.  - Pt to monitor for any household or personal hygiene item changes during the time of development  - try the trimacilonie cream prescribed by Derm over a  selected area. If improvement continue use over other areas.  - CMP ordered, Consider autoimmune work up if not improved (sun and hot showers make worse). GB removed just prior to onset. ? rx to cod liver oil.  - F/U PRN    Electronically signed by: Howard Pouch, DO [place]

## 2016-03-23 NOTE — Patient Instructions (Signed)
You BP is normal today.  Your rash appears to be a reaction to something you are exposed to. Watch changes in any detergents, perfumes etc...  We will followup in November with fasting labs.

## 2016-03-23 NOTE — Telephone Encounter (Signed)
Please call pt: - labs are normal.

## 2016-03-24 NOTE — Telephone Encounter (Signed)
Spoke with patient reviewed lab results. 

## 2016-06-14 ENCOUNTER — Telehealth: Payer: Self-pay | Admitting: Family Medicine

## 2016-06-14 DIAGNOSIS — E785 Hyperlipidemia, unspecified: Secondary | ICD-10-CM | POA: Insufficient documentation

## 2016-06-14 NOTE — Telephone Encounter (Signed)
Lipid panel ordered.

## 2016-06-15 ENCOUNTER — Other Ambulatory Visit (INDEPENDENT_AMBULATORY_CARE_PROVIDER_SITE_OTHER): Payer: Managed Care, Other (non HMO)

## 2016-06-15 DIAGNOSIS — E785 Hyperlipidemia, unspecified: Secondary | ICD-10-CM | POA: Diagnosis not present

## 2016-06-15 LAB — LIPID PANEL
CHOL/HDL RATIO: 4
Cholesterol: 232 mg/dL — ABNORMAL HIGH (ref 0–200)
HDL: 59.4 mg/dL (ref >39.00)
LDL Cholesterol: 156 mg/dL — ABNORMAL HIGH (ref 0–99)
NonHDL: 172.12
TRIGLYCERIDES: 81 mg/dL (ref 0.0–149.0)
VLDL: 16.2 mg/dL (ref 0.0–40.0)

## 2016-06-22 ENCOUNTER — Encounter: Payer: Self-pay | Admitting: Family Medicine

## 2016-06-22 ENCOUNTER — Ambulatory Visit (INDEPENDENT_AMBULATORY_CARE_PROVIDER_SITE_OTHER): Payer: Managed Care, Other (non HMO) | Admitting: Family Medicine

## 2016-06-22 VITALS — BP 118/72 | HR 73 | Temp 98.3°F | Resp 20 | Wt 161.5 lb

## 2016-06-22 DIAGNOSIS — I10 Essential (primary) hypertension: Secondary | ICD-10-CM

## 2016-06-22 DIAGNOSIS — E785 Hyperlipidemia, unspecified: Secondary | ICD-10-CM | POA: Diagnosis not present

## 2016-06-22 NOTE — Progress Notes (Signed)
Patient ID: Ann Cain, female   DOB: [birth year], 45 y.o.   MRN: [id number]      Patient ID: Ann Cain, female  DOB: [birth year], 45 y.o.   MRN: [id number]  Subjective:  Ann Cain is a 45 y.o. female present for follow up on HTN and hyperlipidemia.  HTN: Patient reports compliance with medications today. She is tolerating HCTZ total of 50 mg daily.  Blood pressure is normal today. She is monitoring the salt content in her diet. She is exercising at least 30 minutes day with yoga and walking.  She has started cod liver oil. She also has been evaluated by cardiology for palpitations, with negative work up.   Hyperlipidemia: pt had labs drawn prior to appt. HDL has improved with Cod liver oil (her choice). She desires a solution instead of pill format. She has a family history of heart disease.     Recent Results (from the past 2160 hour(s))  Lipid panel     Status: Abnormal   Collection Time: 06/15/16  8:13 AM  Result Value Ref Range   Cholesterol 232 (H) 0 - 200 mg/dL    Comment: ATP III Classification       Desirable:  < 200 mg/dL               Borderline High:  200 - 239 mg/dL          High:  > = 240 mg/dL   Triglycerides 81.0 0.0 - 149.0 mg/dL    Comment: Normal:  <150 mg/dLBorderline High:  150 - 199 mg/dL   HDL 59.40 >39.00 mg/dL   VLDL 16.2 0.0 - 40.0 mg/dL   LDL Cholesterol 156 (H) 0 - 99 mg/dL   Total CHOL/HDL Ratio 4     Comment:                Men          Women1/2 Average Risk     3.4          3.3Average Risk          5.0          4.42X Average Risk          9.6          7.13X Average Risk          15.0          11.0                       NonHDL 172.12     Comment: NOTE:  Non-HDL goal should be 30 mg/dL higher than patient's LDL goal (i.e. LDL goal of < 70 mg/dL, would have non-HDL goal of < 100 mg/dL)     Past Medical History:  Diagnosis Date  . Acid reflux 2012   egd  . Allergic rhinitis    [place] allergy/asthma patient  . Allergy   . Anal fissure   .  Anemia    prior to hysterectomy   . Asthma   . Colon polyp 2015   tubular adenoma  . Dysrhythmia   . Fibroid uterus   . 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 Hemorrhoid   . History of bronchitis   . History of chicken pox   . History of dizziness   . Hypertension   . Hypothyroidism   . Migraines   . Rectal bleeding    No Known Allergies Past Surgical History:  Procedure Laterality Date  .  48-Hour Cardiac Event Monitor  01/2016   Normal study. Sinus rhythm with artifact. No PACs, PVCs or arrhythmias.  . ABDOMINAL HYSTERECTOMY    . CESAREAN SECTION  2002, 2004  . CHOLECYSTECTOMY N/A 02/19/2016   Procedure: LAPAROSCOPIC CHOLECYSTECTOMY;  Surgeon: Coralie Keens, MD;  Location: [facility];  Service: General;  Laterality: N/A;  . COLONOSCOPY  2015   Dr. Claudie Leach internal hemorrhoids, 2 polyps  . DENTAL SURGERY    . TRANSTHORACIC ECHOCARDIOGRAM  01/21/2016   EF 5560%. No RWMA. Normal Valves. No MVP. No PFO/ASD  . Treadmill Stress Test  01/27/2016   Reached 10.10 METS  Hypertensive response to exercise. No ST deviation. No T-wave inversion. Baseline ST depressions in inferolateral leads remain the same distress.  NEGATIVE GXT   Family History  Problem Relation Age of Onset  . Kidney cancer Father 53  . Lung cancer Maternal Grandfather   . Colon cancer Paternal Grandmother 21  . Heart disease Paternal Grandmother   . Heart disease Paternal Grandfather    Social History   Social History  . Marital status: Married    Spouse name: N/A  . Number of children: N/A  . Years of education: N/A   Occupational History  . Not on file.   Social History Main Topics  . Smoking status: Never Smoker  . Smokeless tobacco: Never Used  . Alcohol use No  . Drug use: No  . Sexual activity: Yes    Birth control/ protection: None   Other Topics Concern  . Not on file   Social History Narrative   Married to [place]. 3 children (one set of twins). 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 [name], [name].    College education. Masters degree, stay-at-home mother.   No tobacco use, no recreational drugs, occasional alcohol.   Drinks caffeinated beverages, takes a daily vitamin   Wears her seatbelt and bicycle helmet   Smoke detector in the home    Feels safe in her relationship.   ROS: Negative, with the exception of above mentioned in HPI  Objective: BP 118/72 (BP Location: Right Arm, Patient Position: Sitting, Cuff Size: Normal)   Pulse 73   Temp 98.3 F (36.8 C)   Resp 20   Wt 161 lb 8 oz (73.3 kg)   SpO2 98%   BMI 23.17 kg/m  Gen: Afebrile. No acute distress. Nontoxic in appearance, well-developed, well-nourished, female  HENT: AT. [place].  MMM Eyes:Pupils Equal Round Reactive to light, Extraocular movements intact,  Conjunctiva without redness, discharge or icterus. CV: RRR, No edema, +2/4 P posterior tibialis pulses. Nocarotid bruits. No JVD. Chest: CTAB, no wheeze, rhonchi or crackles. Normal Respiratory effort. Good  Air movement. Abd: Soft. flat. ND. Mild RUQ discomfort TTP. BS present. No Masses palpated. No hepatosplenomegaly. No rebound tenderness or guarding. Neuro/Msk: Normal gait. PERLA. EOMi. Alert. Oriented x3.   Psych: Normal affect, dress and demeanor. Normal speech. Normal thought content and judgment.   on repeat,  Assessment/plan: Ann Cain is a 45 y.o. female present for HTN F/U: Essential hypertension/hyperlipidemia: - controlled today. Reviewed labs with pt.  -  try fish oil or krill oil daily, solution desired instead of pill. Currently on [place] liver oil . HDL improved, still could use more improvement on LDL. She does not desire statin and has anxiety surrounding medications.  - Continue HCTZ 25 mg BID, refills prescribed. Since tolerating can call in the 50 mg dose on next refill.  - pt to monitor at home if routinely above 140/90 will need  to see sooner.  - low salt diet and exercise continued - BMP and lipids yearly.  - 6 month f/u   Electronically  signed by: Howard Pouch, DO [place]

## 2016-06-22 NOTE — Patient Instructions (Signed)
Your BP and cholesterol are good today .  You can use fish oil or krill oil, we will test lipids yearly with preventive/Physical.   Low salt and keep up the diet and exercise.

## 2016-08-27 ENCOUNTER — Encounter: Payer: Self-pay | Admitting: *Deleted

## 2016-08-27 ENCOUNTER — Telehealth: Payer: Self-pay | Admitting: *Deleted

## 2016-08-27 MED ORDER — LEVOTHYROXINE SODIUM 100 MCG PO TABS: 100.0000 ug | ORAL_TABLET | Freq: Every day | ORAL | 4 refills | Status: DC

## 2016-08-27 NOTE — Telephone Encounter (Signed)
Patient called and left message requesting an Rx for levothyroxine 151mcg. Patient states her GYN was prescribing this but advised her to get it from her PCP now. Her last TSH was drawn in May 2017. Last Ov was 06/22/16 for HTN. Please advise.

## 2016-09-17 ENCOUNTER — Encounter: Payer: Self-pay | Admitting: Family Medicine

## 2016-09-23 ENCOUNTER — Encounter: Payer: Self-pay | Admitting: Family Medicine

## 2016-10-13 ENCOUNTER — Other Ambulatory Visit: Payer: Self-pay | Admitting: *Deleted

## 2016-10-13 DIAGNOSIS — I10 Essential (primary) hypertension: Secondary | ICD-10-CM

## 2016-10-13 MED ORDER — HYDROCHLOROTHIAZIDE 25 MG PO TABS: 50.0000 mg | ORAL_TABLET | Freq: Every day | ORAL | 0 refills | Status: DC

## 2016-12-21 ENCOUNTER — Ambulatory Visit (INDEPENDENT_AMBULATORY_CARE_PROVIDER_SITE_OTHER): Payer: Managed Care, Other (non HMO) | Admitting: Family Medicine

## 2016-12-21 ENCOUNTER — Telehealth: Payer: Self-pay | Admitting: Family Medicine

## 2016-12-21 ENCOUNTER — Encounter: Payer: Self-pay | Admitting: Family Medicine

## 2016-12-21 VITALS — BP 122/78 | HR 81 | Temp 98.4°F | Resp 20 | Ht 70.0 in | Wt 167.2 lb

## 2016-12-21 DIAGNOSIS — I10 Essential (primary) hypertension: Secondary | ICD-10-CM | POA: Diagnosis not present

## 2016-12-21 DIAGNOSIS — E785 Hyperlipidemia, unspecified: Secondary | ICD-10-CM | POA: Diagnosis not present

## 2016-12-21 DIAGNOSIS — E034 Atrophy of thyroid (acquired): Secondary | ICD-10-CM

## 2016-12-21 LAB — TSH: TSH: 2.23 u[IU]/mL (ref 0.35–4.50)

## 2016-12-21 MED ORDER — LEVOTHYROXINE SODIUM 100 MCG PO TABS: 100.0000 ug | ORAL_TABLET | Freq: Every day | ORAL | 3 refills | Status: DC

## 2016-12-21 MED ORDER — HYDROCHLOROTHIAZIDE 25 MG PO TABS: 50.0000 mg | ORAL_TABLET | Freq: Every day | ORAL | 1 refills | Status: DC

## 2016-12-21 NOTE — Progress Notes (Signed)
Patient ID: Ann Cain, female   DOB: 10/15/1970, 46 y.o.   MRN: [id number]      Patient ID: Ann Cain, female  DOB: Oct 09, 1970, 46 y.o.   MRN: [id number] Chief Complaint  Patient presents with  . Hypertension    Subjective:  Ann Cain is a 46 y.o. female present for follow up on  HTN: Patient reports compliance with medications today. She is tolerating HCTZ total of 50 mg daily.  Blood pressure is normal today. She is monitoring the salt content in her diet. She is exercising routinely. Denies chest pain, shortness of breath or LE edema. She has noticed salt does make her retain fluid. She also has been evaluated by cardiology for palpitations, with negative work up. She is anxious and initial BP is elevated today. She has mild white coat syndrome.   Hyperlipidemia: Pt is now taking fish oil supplementation, liquid. She desires a solution instead of pill format. She has a family history of heart disease. Last lipids 06/15/2016.  Lipid Panel     Component Value Date/Time   CHOL 232 (H) 06/15/2016 0813   TRIG 81.0 06/15/2016 0813   HDL 59.40 06/15/2016 0813   CHOLHDL 4 06/15/2016 0813   VLDL 16.2 06/15/2016 0813   LDLCALC 156 (H) 06/15/2016 0813    Hypothyroid: Last TSH 12/2015 WNL. Pt reports compliance with levothyroxine 100 mcg QD. She is in need of refills today.     No results found for this or any previous visit (from the past 2160 hour(s)).   Past Medical History:  Diagnosis Date  . Acid reflux 2012   egd  . Allergic rhinitis    [place] allergy/asthma patient  . Allergy   . Anal fissure   . Anemia    prior to hysterectomy   . Asthma   . Colon polyp 2015   tubular adenoma  . Dysrhythmia   . Fibroid uterus   . 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 Hemorrhoid   . History of bronchitis   . History of chicken pox   . History of dizziness   . Hypertension   . Hypothyroidism   . Migraines   . Rectal bleeding    No Known Allergies Past Surgical History:    Procedure Laterality Date  . 48-Hour Cardiac Event Monitor  01/2016   Normal study. Sinus rhythm with artifact. No PACs, PVCs or arrhythmias.  . ABDOMINAL HYSTERECTOMY    . CESAREAN SECTION  2002, 2004  . CHOLECYSTECTOMY N/A 02/19/2016   Procedure: LAPAROSCOPIC CHOLECYSTECTOMY;  Surgeon: Coralie Keens, MD;  Location: [facility];  Service: General;  Laterality: N/A;  . COLONOSCOPY  2015   Dr. Claudie Leach internal hemorrhoids, 2 polyps  . DENTAL SURGERY    . TRANSTHORACIC ECHOCARDIOGRAM  01/21/2016   EF 5560%. No RWMA. Normal Valves. No MVP. No PFO/ASD  . Treadmill Stress Test  01/27/2016   Reached 10.10 METS  Hypertensive response to exercise. No ST deviation. No T-wave inversion. Baseline ST depressions in inferolateral leads remain the same distress.  NEGATIVE GXT   Family History  Problem Relation Age of Onset  . Kidney cancer Father 38  . Lung cancer Maternal Grandfather   . Colon cancer Paternal Grandmother 44  . Heart disease Paternal Grandmother   . Heart disease Paternal Grandfather    Social History   Social History  . Marital status: Married    Spouse name: N/A  . Number of children: N/A  . Years of education: N/A   Occupational  History  . Not on file.   Social History Main Topics  . Smoking status: Never Smoker  . Smokeless tobacco: Never Used  . Alcohol use No  . Drug use: No  . Sexual activity: Yes    Birth control/ protection: None   Other Topics Concern  . Not on file   Social History Narrative   Married to [place]. 3 children (one set of twins). 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 [name], [name].   College education. Masters degree, stay-at-home mother.   No tobacco use, no recreational drugs, occasional alcohol.   Drinks caffeinated beverages, takes a daily vitamin   Wears her seatbelt and bicycle helmet   Smoke detector in the home    Feels safe in her relationship.   ROS: Negative, with the exception of above mentioned in HPI  Objective: BP 122/78 (BP  Location: Left Arm, Patient Position: Sitting, Cuff Size: Normal)   Pulse 81   Temp 98.4 F (36.9 C)   Resp 20   Ht 5\' 10"  (1.778 m)   Wt 167 lb 4 oz (75.9 kg)   SpO2 100%   BMI 24.00 kg/m   Gen: Afebrile. No acute distress. Nontoxic in appearance.  HENT: AT. [place]. MMM.  Eyes:Pupils Equal Round Reactive to light, Extraocular movements intact,  Conjunctiva without redness, discharge or icterus. Neck/lymp/endocrine: Supple,no lymphadenopathy, no thyromegaly CV: RRR no murmur, no edema, +2/4 P posterior tibialis pulses Chest: CTAB, no wheeze or crackles Abd: Soft. NTND. BS present. no Masses palpated.  Neuro: Normal gait. PERLA. EOMi. Alert. Oriented x3  Psych: mildly anxious. Normal affect, dress and demeanor. Normal speech. Normal thought content and judgment.   Assessment/plan: Kieana Livesay is a 46 y.o. female present for  Essential hypertension/hyperlipidemia: - stable today. Pt is nervous and usually needs to sit and have BP repeated. Repeat pressure is normal.  - Now taking fish oil/omega 3 - Continue HCTZ 25 mg BID, refills prescribed. Since tolerating can call in the 50 mg dose on next refill.  - pt to monitor at home if routinely above 130/90 will need to see sooner.  - low salt diet and exercise continued - BMP and lipids yearly.  - 6 month f/u   Hypothyroid:  - TSH collected today.  - Levothyroxine 100 mcg QD will be refilled after results.  - follow yearly.   Electronically signed by: Howard Pouch, DO [place]

## 2016-12-21 NOTE — Telephone Encounter (Signed)
Please call pt: - TSH is normal. Med refilled.

## 2016-12-21 NOTE — Patient Instructions (Signed)
You look great today. BP is normal today. Goal < 130/90. I have refilled your medicine.  We will call you with the thyroid lab results and refill med for you.  Please make appt for physical and fasting lab collection 2 days prior.   Please help [place] help you:  We are honored you have chosen [facility] for your Primary Care home. Below you will find basic instructions that you may need to access in the future. Please help [place] help you by reading the instructions, which cover many of the frequent questions we experience.   Prescription refills and request:  -In order to allow more efficient response time, please call your pharmacy for all refills. They will forward the request electronically to [place]. This allows for the quickest possible response. Request left on a nurse line can take longer to refill, since these are checked as time allows between office patients and other phone calls.  - refill request can take up to 3-5 working days to complete.  - If request is sent electronically and request is appropiate, it is usually completed in 1-2 business days.  - all patients will need to be seen routinely for all chronic medical conditions requiring prescription medications (see follow-up below). If you are overdue for follow up on your condition, you will be asked to make an appointment and we will call in enough medication to cover you until your appointment (up to 30 days).  - all controlled substances will require a face to face visit to request/refill.  - if you desire your prescriptions to go through a new pharmacy, and have an active script at original pharmacy, you will need to call your pharmacy and have scripts transferred to new pharmacy. This is completed between the pharmacy locations and not by your provider.    Results: If any images or labs were ordered, it can take up to 1 week to get results depending on the test ordered and the lab/facility running and resulting the test. - Normal  or stable results, which do not need further discussion, may be released to your mychart immediately with attached note to you. A call may not be generated for normal results. Please make certain to sign up for mychart. If you have questions on how to activate your mychart you can call the front office.  - If your results need further discussion, our office will attempt to contact you via phone, and if unable to reach you after 2 attempts, we will release your abnormal result to your mychart with instructions.  - All results will be automatically released in mychart after 1 week.  - Your provider will provide you with explanation and instruction on all relevant material in your results. Please keep in mind, results and labs may appear confusing or abnormal to the untrained eye, but it does not mean they are actually abnormal for you personally. If you have any questions about your results that are not covered, or you desire more detailed explanation than what was provided, you should make an appointment with your provider to do so.   Our office handles many outgoing and incoming calls daily. If we have not contacted you within 1 week about your results, please check your mychart to see if there is a message first and if not, then contact our office.  In helping with this matter, you help decrease call volume, and therefore allow [place] to be able to respond to patients needs more efficiently.   Acute  office visits (sick visit):  An acute visit is intended for a new problem and are scheduled in shorter time slots to allow schedule openings for patients with new problems. This is the appropriate visit to discuss a new problem. In order to provide you with excellent quality medical care with proper time for you to explain your problem, have an exam and receive treatment with instructions, these appointments should be limited to one new problem per visit. If you experience a new problem, in which you desire to be  addressed, please make an acute office visit, we save openings on the schedule to accommodate you. Please do not save your new problem for any other type of visit, let us take care of it properly and quickly for you.   Follow up visits:  Depending on your condition(s) your provider will need to see you routinely in order to provide you with quality care and prescribe medication(s). Most chronic conditions (Example: hypertension, Diabetes, depression/anxiety... etc), require visits a couple times a year. Your provider will instruct you on proper follow up for your personal medical conditions and history. Please make certain to make follow up appointments for your condition as instructed. Failing to do so could result in lapse in your medication treatment/refills. If you request a refill, and are overdue to be seen on a condition, we will always provide you with a 30 day script (once) to allow you time to schedule.    Medicare wellness (well visit): - we have a wonderful Nurse Maudie Mercury), that will meet with you and provide you will yearly medicare wellness visits. These visits should occur yearly (can not be scheduled less than 1 calendar year apart) and cover preventive health, immunizations, advance directives and screenings you are entitled to yearly through your medicare benefits. Do not miss out on your entitled benefits, this is when medicare will pay for these benefits to be ordered for you.  These are strongly encouraged by your provider and is the appropriate type of visit to make certain you are up to date with all preventive health benefits. If you have not had your medicare wellness exam in the last 12 months, please make certain to schedule one by calling the office and schedule your medicare wellness with Maudie Mercury as soon as possible.   Yearly physical (well visit):  - Adults are recommended to be seen yearly for physicals. Check with your insurance and date of your last physical, most insurances  require one calendar year between physicals. Physicals include all preventive health topics, screenings, medical exam and labs that are appropriate for gender/age and history. You may have fasting labs needed at this visit. This is a well visit (not a sick visit), new problems should not be covered during this visit (see acute visit).  - Pediatric patients are seen more frequently when they are younger. Your provider will advise you on well child visit timing that is appropriate for your their age. - This is not a medicare wellness visit. Medicare wellness exams do not have an exam portion to the visit. Some medicare companies allow for a physical, some do not allow a yearly physical. If your medicare allows a yearly physical you can schedule the medicare wellness with our nurse Maudie Mercury and have your physical with your provider after, on the same day. Please check with insurance for your full benefits.   Late Policy/No Shows:  - all new patients should arrive 15-30 minutes earlier than appointment to allow [place] time  to  obtain all personal demographics,  insurance information and for you to complete office paperwork. - All established patients should arrive 10-15 minutes earlier than appointment time to update all information and be checked in .  - In our best efforts to run on time, if you are late for your appointment you will be asked to either reschedule or if able, we will work you back into the schedule. There will be a wait time to work you back in the schedule,  depending on availability.  - If you are unable to make it to your appointment as scheduled, please call 24 hours ahead of time to allow [place] to fill the time slot with someone else who needs to be seen. If you do not cancel your appointment ahead of time, you may be charged a no show fee.

## 2016-12-22 NOTE — Telephone Encounter (Signed)
Left detailed message on cell of normal results and that Rx was sent to [facility] pharmacy.  Okay per DPR.

## 2017-01-12 ENCOUNTER — Other Ambulatory Visit (INDEPENDENT_AMBULATORY_CARE_PROVIDER_SITE_OTHER): Payer: Managed Care, Other (non HMO)

## 2017-01-12 DIAGNOSIS — E785 Hyperlipidemia, unspecified: Secondary | ICD-10-CM | POA: Diagnosis not present

## 2017-01-12 DIAGNOSIS — I10 Essential (primary) hypertension: Secondary | ICD-10-CM | POA: Diagnosis not present

## 2017-01-12 DIAGNOSIS — E034 Atrophy of thyroid (acquired): Secondary | ICD-10-CM

## 2017-01-12 LAB — CBC WITH DIFFERENTIAL/PLATELET
BASOS PCT: 0.8 % (ref 0.0–3.0)
Basophils Absolute: 0 10*3/uL (ref 0.0–0.1)
EOS ABS: 0.2 10*3/uL (ref 0.0–0.7)
Eosinophils Relative: 3.1 % (ref 0.0–5.0)
HCT: 42.7 % (ref 36.0–46.0)
Hemoglobin: 14.1 g/dL (ref 12.0–15.0)
Lymphocytes Relative: 18.3 % (ref 12.0–46.0)
Lymphs Abs: 1 10*3/uL (ref 0.7–4.0)
MCHC: 33 g/dL (ref 30.0–36.0)
MCV: 94.2 fl (ref 78.0–100.0)
MONO ABS: 0.6 10*3/uL (ref 0.1–1.0)
Monocytes Relative: 11.1 % (ref 3.0–12.0)
NEUTROS ABS: 3.5 10*3/uL (ref 1.4–7.7)
NEUTROS PCT: 66.7 % (ref 43.0–77.0)
Platelets: 220 10*3/uL (ref 150.0–400.0)
RBC: 4.53 Mil/uL (ref 3.87–5.11)
RDW: 12.5 % (ref 11.5–15.5)
WBC: 5.3 10*3/uL (ref 4.0–10.5)

## 2017-01-12 LAB — LIPID PANEL
CHOLESTEROL: 222 mg/dL — AB (ref 0–200)
HDL: 56.9 mg/dL (ref >39.00)
LDL CALC: 142 mg/dL — AB (ref 0–99)
NonHDL: 164.69
Total CHOL/HDL Ratio: 4
Triglycerides: 114 mg/dL (ref 0.0–149.0)
VLDL: 22.8 mg/dL (ref 0.0–40.0)

## 2017-01-12 LAB — BASIC METABOLIC PANEL WITH GFR
BUN: 21 mg/dL (ref 7–25)
CALCIUM: 9.2 mg/dL (ref 8.6–10.2)
CO2: 28 mmol/L (ref 20–31)
Chloride: 107 mmol/L (ref 98–110)
Creat: 0.84 mg/dL (ref 0.50–1.10)
GFR, EST NON AFRICAN AMERICAN: 85 mL/min (ref >=60)
GLUCOSE: 94 mg/dL (ref 65–99)
Potassium: 4.5 mmol/L (ref 3.5–5.3)
SODIUM: 140 mmol/L (ref 135–146)

## 2017-01-13 ENCOUNTER — Telehealth: Payer: Self-pay | Admitting: Family Medicine

## 2017-01-13 LAB — HEMOGLOBIN A1C
HEMOGLOBIN A1C: 5.4 % (ref <5.7)
MEAN PLASMA GLUCOSE: 108 mg/dL

## 2017-01-13 NOTE — Telephone Encounter (Signed)
Labs are stable. Pt appt this week will discuss in detail with her.

## 2017-01-14 ENCOUNTER — Encounter: Payer: Self-pay | Admitting: Family Medicine

## 2017-01-14 ENCOUNTER — Ambulatory Visit (INDEPENDENT_AMBULATORY_CARE_PROVIDER_SITE_OTHER): Payer: Managed Care, Other (non HMO) | Admitting: Family Medicine

## 2017-01-14 VITALS — BP 128/87 | HR 63 | Temp 98.4°F | Resp 20 | Ht 70.0 in | Wt 171.8 lb

## 2017-01-14 DIAGNOSIS — Z0001 Encounter for general adult medical examination with abnormal findings: Secondary | ICD-10-CM

## 2017-01-14 DIAGNOSIS — Z0000 Encounter for general adult medical examination without abnormal findings: Secondary | ICD-10-CM

## 2017-01-14 DIAGNOSIS — E785 Hyperlipidemia, unspecified: Secondary | ICD-10-CM | POA: Diagnosis not present

## 2017-01-14 DIAGNOSIS — R7309 Other abnormal glucose: Secondary | ICD-10-CM

## 2017-01-14 DIAGNOSIS — I10 Essential (primary) hypertension: Secondary | ICD-10-CM | POA: Diagnosis not present

## 2017-01-14 NOTE — Progress Notes (Signed)
Patient ID: Ann Cain, female  DOB: 1971/02/18, 46 y.o.   MRN: [id number] Patient Care Team    Relationship Specialty Notifications Start End  Ma Hillock, DO PCP - General Family Medicine  10/30/15   Aloha Gell, MD Consulting Physician Obstetrics and Gynecology  10/30/15   Carol Ada, MD Consulting Physician Gastroenterology  01/14/17     Chief Complaint  Patient presents with  . Annual Exam    Subjective:  Ann Cain is a 46 y.o.  Female  present for CPE. All past medical history, surgical history, allergies, family history, immunizations, medications and social history were updated in the electronic medical record today. All recent labs, ED visits and hospitalizations within the last year were reviewed.  Hypertension/hyperlipidemia: Patient reports she is taking 3-4 grams of the Nordic natural omega-3 solution. She had stop using the HCTZ secondary to a dermatologist recommendation that it could be the cause of her rash on her arms. She reports her blood pressure at home have stayed within the 120/80 range and brings results with her today. She denies chest pain, shortness of breath, lower extremity edema or rash.  Health maintenance:  Updated in the today with changes if appropriate. 01/14/2017 Colonoscopy: 03/02/2014; adenoma x2, 3 year follow-up due August 2018. --< trying to schedule with Dr.Hung.  Mammogram: 01/2016; normal, has completed at gynecologist ([facility]). Normal per patient. Cervical cancer screening: 10/01/2014, hysterectomy, GYN [facility] Immunizations: tdap UTD 5038, pt uncertain last but likely with pregnancy >10 years ago.  Infectious disease screening: HIV with pregnancy 2014 DEXA:Not indicated  Last eye exam 2015 Assistive device: None Oxygen use: None Patient has a Dental home. Hospitalizations/ED visits: None    Depression screen [facility] 2/9 01/14/2017 12/22/2015 12/17/2015  Decreased Interest 0 0 0  Down, Depressed, Hopeless 0 0  0  PHQ - 2 Score 0 0 0   GAD 7 : Generalized Anxiety Score 12/17/2015  Nervous, Anxious, on Edge 2  Control/stop worrying 0  Worry too much - different things 0  Trouble relaxing 1  Restless 0  Easily annoyed or irritable 1  Afraid - awful might happen 1  Total GAD 7 Score 5     Current Exercise Habits: Home exercise routine, Type of exercise: walking, Time (Minutes): 60, Frequency (Times/Week): 6, Weekly Exercise (Minutes/Week): 360, Intensity: Mild    Immunization History  Administered Date(s) Administered  . Tdap 12/22/2015     Past Medical History:  Diagnosis Date  . Acid reflux 2012   egd  . Allergic rhinitis    [facility] allergy/asthma patient  . Allergy   . Anal fissure   . Anemia    prior to hysterectomy   . Asthma   . Colon polyp 2015   tubular adenoma  . Dysrhythmia   . Fibroid uterus   . 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 Hemorrhoid   . History of bronchitis   . History of chicken pox   . History of dizziness   . Hypertension   . Hypothyroidism   . Migraines   . Rectal bleeding    No Known Allergies Past Surgical History:  Procedure Laterality Date  . 48-Hour Cardiac Event Monitor  01/2016   Normal study. Sinus rhythm with artifact. No PACs, PVCs or arrhythmias.  . ABDOMINAL HYSTERECTOMY    . CESAREAN SECTION  2002, 2004  . CHOLECYSTECTOMY N/A 02/19/2016   Procedure: LAPAROSCOPIC CHOLECYSTECTOMY;  Surgeon: Coralie Keens, MD;  Location: [facility];  Service: General;  Laterality: N/A;  . COLONOSCOPY  2015   Dr. Claudie Leach internal hemorrhoids, 2 polyps  . DENTAL SURGERY    . TRANSTHORACIC ECHOCARDIOGRAM  01/21/2016   EF 5560%. No RWMA. Normal Valves. No MVP. No PFO/ASD  . Treadmill Stress Test  01/27/2016   Reached 10.10 METS  Hypertensive response to exercise. No ST deviation. No T-wave inversion. Baseline ST depressions in inferolateral leads remain the same distress.  NEGATIVE GXT   Family History  Problem Relation Age of Onset  . Kidney cancer Father  4  . Lung cancer Maternal Grandfather   . Colon cancer Paternal Grandmother 44  . Heart disease Paternal Grandmother   . Heart disease Paternal Grandfather    Social History   Social History  . Marital status: Married    Spouse name: N/A  . Number of children: N/A  . Years of education: N/A   Occupational History  . Not on file.   Social History Main Topics  . Smoking status: Never Smoker  . Smokeless tobacco: Never Used  . Alcohol use No  . Drug use: No  . Sexual activity: Yes    Birth control/ protection: None   Other Topics Concern  . Not on file   Social History Narrative   Married to [place]. 3 children (one set of twins). 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 Ann Cain, Ann Cain.   College education. Masters degree, stay-at-home mother.   No tobacco use, no recreational drugs, occasional alcohol.   Drinks caffeinated beverages, takes a daily vitamin   Wears her seatbelt and bicycle helmet   Smoke detector in the home    Feels safe in her relationship.   Allergies as of 01/14/2017   No Known Allergies     Medication List       Accurate as of 01/14/17  1:08 PM. Always use your most recent med list.          acetaminophen 500 MG tablet Commonly known as:  TYLENOL Take 500 mg by mouth every 6 (six) hours as needed for headache.   levothyroxine 100 MCG tablet Commonly known as:  SYNTHROID, LEVOTHROID Take 1 tablet (100 mcg total) by mouth daily before breakfast.   lidocaine-prilocaine cream Commonly known as:  EMLA APPLY ONCE TOPICALLY AS DIRECTED   PROAIR RESPICLICK 188 (90 Base) MCG/ACT Aepb Generic drug:  Albuterol Sulfate Inhale 2 puffs into the lungs every 6 (six) hours as needed (wheezing).   tretinoin 0.025 % cream Commonly known as:  RETIN-A APPLY 1 APPLICATION A PEARL-SIZED AMOUNT TO FACE IN THE EVENING ONCE A DAY   valACYclovir 500 MG tablet Commonly known as:  VALTREX TAKE 1 TABLET BY MOUTH DAILY AS PREVENTION, INCREASE TO TWICE A DAY FOR 3 DAYS  WITH LESIONS       All past medical history, surgical history, allergies, family history, immunizations andmedications were updated in the EMR today and reviewed under the history and medication portions of their EMR.     Recent Results (from the past 2160 hour(s))  TSH     Status: None   Collection Time: 12/21/16 10:11 AM  Result Value Ref Range   TSH 2.23 0.35 - 4.50 uIU/mL  CBC w/Diff     Status: None   Collection Time: 01/12/17  8:02 AM  Result Value Ref Range   WBC 5.3 4.0 - 10.5 K/uL   RBC 4.53 3.87 - 5.11 Mil/uL   Hemoglobin 14.1 12.0 - 15.0 g/dL   HCT 42.7 36.0 - 46.0 %   MCV 94.2  78.0 - 100.0 fl   MCHC 33.0 30.0 - 36.0 g/dL   RDW 12.5 11.5 - 15.5 %   Platelets 220.0 150.0 - 400.0 K/uL   Neutrophils Relative % 66.7 43.0 - 77.0 %   Lymphocytes Relative 18.3 12.0 - 46.0 %   Monocytes Relative 11.1 3.0 - 12.0 %   Eosinophils Relative 3.1 0.0 - 5.0 %   Basophils Relative 0.8 0.0 - 3.0 %   Neutro Abs 3.5 1.4 - 7.7 K/uL   Lymphs Abs 1.0 0.7 - 4.0 K/uL   Monocytes Absolute 0.6 0.1 - 1.0 K/uL   Eosinophils Absolute 0.2 0.0 - 0.7 K/uL   Basophils Absolute 0.0 0.0 - 0.1 K/uL  BASIC METABOLIC PANEL WITH GFR     Status: None   Collection Time: 01/12/17  8:02 AM  Result Value Ref Range   Sodium 140 135 - 146 mmol/L   Potassium 4.5 3.5 - 5.3 mmol/L   Chloride 107 98 - 110 mmol/L   CO2 28 20 - 31 mmol/L   Glucose, Bld 94 65 - 99 mg/dL   BUN 21 7 - 25 mg/dL   Creat 0.84 0.50 - 1.10 mg/dL   Calcium 9.2 8.6 - 10.2 mg/dL   GFR, Est African American >89 >=60 mL/min   GFR, Est Non African American 85 >=60 mL/min  Hemoglobin A1c     Status: None   Collection Time: 01/12/17  8:02 AM  Result Value Ref Range   Hgb A1c MFr Bld 5.4 <5.7 %    Comment:   For the purpose of screening for the presence of diabetes:   <5.7%       Consistent with the absence of diabetes 5.7-6.4 %   Consistent with increased risk for diabetes (prediabetes) >=6.5 %     Consistent with diabetes   This  assay result is consistent with a decreased risk of diabetes.   Currently, no consensus exists regarding use of hemoglobin A1c for diagnosis of diabetes in children.   According to American Diabetes Association (ADA) guidelines, hemoglobin A1c <7.0% represents optimal control in non-pregnant diabetic patients. Different metrics may apply to specific patient populations. Standards of Medical Care in Diabetes (ADA).      Mean Plasma Glucose 108 mg/dL  Lipid panel     Status: Abnormal   Collection Time: 01/12/17  8:02 AM  Result Value Ref Range   Cholesterol 222 (H) 0 - 200 mg/dL    Comment: ATP III Classification       Desirable:  < 200 mg/dL               Borderline High:  200 - 239 mg/dL          High:  > = 240 mg/dL   Triglycerides 114.0 0.0 - 149.0 mg/dL    Comment: Normal:  <150 mg/dLBorderline High:  150 - 199 mg/dL   HDL 56.90 >39.00 mg/dL   VLDL 22.8 0.0 - 40.0 mg/dL   LDL Cholesterol 142 (H) 0 - 99 mg/dL   Total CHOL/HDL Ratio 4     Comment:                Men          Women1/2 Average Risk     3.4          3.3Average Risk          5.0          4.42X Average Risk  9.6          7.13X Average Risk          15.0          11.0                       NonHDL 164.69     Comment: NOTE:  Non-HDL goal should be 30 mg/dL higher than patient's LDL goal (i.e. LDL goal of < 70 mg/dL, would have non-HDL goal of < 100 mg/dL)    No results found.   ROS: 14 pt review of systems performed and negative (unless mentioned in an HPI)  Objective: BP 128/87 (BP Location: Left Arm, Patient Position: Sitting, Cuff Size: Normal)   Pulse 63   Temp 98.4 F (36.9 C)   Resp 20   Ht 5\' 10"  (1.778 m)   Wt 171 lb 12 oz (77.9 kg)   SpO2 100%   BMI 24.64 kg/m  Gen: Afebrile. No acute distress. Nontoxic in appearance, well-developed, well-nourished,  Pleasant, Caucasian female. HENT: AT. [place]. Bilateral TM visualized and normal in appearance, normal external auditory canal. MMM, no oral lesions,  adequate dentition. Bilateral nares within normal limits. Throat without erythema, ulcerations or exudates. No Cough on exam, no hoarseness on exam. Eyes:Pupils Equal Round Reactive to light, Extraocular movements intact,  Conjunctiva without redness, discharge or icterus. Neck/lymp/endocrine: Supple, no lymphadenopathy, no thyromegaly CV: RRR 1/6 SM, no edema, +2/4 P posterior tibialis pulses. No carotid bruits. No JVD. Chest: CTAB, no wheeze, rhonchi or crackles. Normal Respiratory effort. Good Air movement. Abd: Soft. Flat. NTND. BS present. No Masses palpated. No hepatosplenomegaly. No rebound tenderness or guarding. Skin: No rashes, purpura or petechiae. Warm and well-perfused. Skin intact. Neuro/Msk:  Normal gait. PERLA. EOMi. Alert. Oriented x3.  Cranial nerves II through XII intact. Muscle strength 5/5 upper/lower extremity. DTRs equal bilaterally. Psych: Normal affect, dress and demeanor. Normal speech. Normal thought content and judgment.   No exam data present  Assessment/plan: Brittny Spangle is a 46 y.o. female present for CPE. Encounter for preventive health examination Patient was encouraged to exercise greater than 150 minutes a week. Patient was encouraged to choose a diet filled with fresh fruits and vegetables, and lean meats. AVS provided to patient today for education/recommendation on gender specific health and safety maintenance. - All immunizations and screenings are up-to-date with the exception of her repeat colonoscopy needs to be scheduled. It is to August 2018 for prior history of 2 adenomatous colon polyps. She reports she is in the process of scheduling with GI (Dr.Hung) who has requested her prior records from [place]. - She states she will make sure there is follow through on this, and she will have her colonoscopy completed this year.  Essential hypertension/elevated hemoglobin A1c - Resolved. Patient's blood pressure is stable without medication.  Congratulated her... she has lost approximately 20 pounds. He has made a big difference in her blood pressure and hemoglobin A1c. - Her A1c is 5.4, normal. Would continue only routine screenings with physicals.  Hyperlipidemia LDL goal <100 - Still mildly elevated, but is improved with fish oil supplementation, diet and exercise. Continue current regimen. Would follow yearly with fasting labs at the school.  Return in about 1 year (around 01/14/2018) for CPE.  Electronically signed by: Howard Pouch, DO [place]

## 2017-01-14 NOTE — Patient Instructions (Signed)
Health Maintenance, Female Adopting a healthy lifestyle and getting preventive care can go a long way to promote health and wellness. Talk with your health care provider about what schedule of regular examinations is right for you. This is a good chance for you to check in with your provider about disease prevention and staying healthy. In between checkups, there are plenty of things you can do on your own. Experts have done a lot of research about which lifestyle changes and preventive measures are most likely to keep you healthy. Ask your health care provider for more information. Weight and diet Eat a healthy diet  Be sure to include plenty of vegetables, fruits, low-fat dairy products, and lean protein.  Do not eat a lot of foods high in solid fats, added sugars, or salt.  Get regular exercise. This is one of the most important things you can do for your health. ? Most adults should exercise for at least 150 minutes each week. The exercise should increase your heart rate and make you sweat (moderate-intensity exercise). ? Most adults should also do strengthening exercises at least twice a week. This is in addition to the moderate-intensity exercise.  Maintain a healthy weight  Body mass index (BMI) is a measurement that can be used to identify possible weight problems. It estimates body fat based on height and weight. Your health care provider can help determine your BMI and help you achieve or maintain a healthy weight.  For females 20 years of age and older: ? A BMI below 18.5 is considered underweight. ? A BMI of 18.5 to 24.9 is normal. ? A BMI of 25 to 29.9 is considered overweight. ? A BMI of 30 and above is considered obese.  Watch levels of cholesterol and blood lipids  You should start having your blood tested for lipids and cholesterol at 44 years of age, then have this test every 5 years.  You may need to have your cholesterol levels checked more often if: ? Your lipid or  cholesterol levels are high. ? You are older than 44 years of age. ? You are at high risk for heart disease.  Cancer screening Lung Cancer  Lung cancer screening is recommended for adults 55-80 years old who are at high risk for lung cancer because of a history of smoking.  A yearly low-dose CT scan of the lungs is recommended for people who: ? Currently smoke. ? Have quit within the past 15 years. ? Have at least a 30-pack-year history of smoking. A pack year is smoking an average of one pack of cigarettes a day for 1 year.  Yearly screening should continue until it has been 15 years since you quit.  Yearly screening should stop if you develop a health problem that would prevent you from having lung cancer treatment.  Breast Cancer  Practice breast self-awareness. This means understanding how your breasts normally appear and feel.  It also means doing regular breast self-exams. Let your health care provider know about any changes, no matter how small.  If you are in your 20s or 30s, you should have a clinical breast exam (CBE) by a health care provider every 1-3 years as part of a regular health exam.  If you are 40 or older, have a CBE every year. Also consider having a breast X-ray (mammogram) every year.  If you have a family history of breast cancer, talk to your health care provider about genetic screening.  If you are at high risk   for breast cancer, talk to your health care provider about having an MRI and a mammogram every year.  Breast cancer gene (BRCA) assessment is recommended for women who have family members with BRCA-related cancers. BRCA-related cancers include: ? Breast. ? Ovarian. ? Tubal. ? Peritoneal cancers.  Results of the assessment will determine the need for genetic counseling and BRCA1 and BRCA2 testing.  Cervical Cancer Your health care provider may recommend that you be screened regularly for cancer of the pelvic organs (ovaries, uterus, and  vagina). This screening involves a pelvic examination, including checking for microscopic changes to the surface of your cervix (Pap test). You may be encouraged to have this screening done every 3 years, beginning at age 22.  For women ages 56-65, health care providers may recommend pelvic exams and Pap testing every 3 years, or they may recommend the Pap and pelvic exam, combined with testing for human papilloma virus (HPV), every 5 years. Some types of HPV increase your risk of cervical cancer. Testing for HPV may also be done on women of any age with unclear Pap test results.  Other health care providers may not recommend any screening for nonpregnant women who are considered low risk for pelvic cancer and who do not have symptoms. Ask your health care provider if a screening pelvic exam is right for you.  If you have had past treatment for cervical cancer or a condition that could lead to cancer, you need Pap tests and screening for cancer for at least 20 years after your treatment. If Pap tests have been discontinued, your risk factors (such as having a new sexual partner) need to be reassessed to determine if screening should resume. Some women have medical problems that increase the chance of getting cervical cancer. In these cases, your health care provider may recommend more frequent screening and Pap tests.  Colorectal Cancer  This type of cancer can be detected and often prevented.  Routine colorectal cancer screening usually begins at 44 years of age and continues through 44 years of age.  Your health care provider may recommend screening at an earlier age if you have risk factors for colon cancer.  Your health care provider may also recommend using home test kits to check for hidden blood in the stool.  A small camera at the end of a tube can be used to examine your colon directly (sigmoidoscopy or colonoscopy). This is done to check for the earliest forms of colorectal  cancer.  Routine screening usually begins at age 33.  Direct examination of the colon should be repeated every 5-10 years through 43 years of age. However, you may need to be screened more often if early forms of precancerous polyps or small growths are found.  Skin Cancer  Check your skin from head to toe regularly.  Tell your health care provider about any new moles or changes in moles, especially if there is a change in a mole's shape or color.  Also tell your health care provider if you have a mole that is larger than the size of a pencil eraser.  Always use sunscreen. Apply sunscreen liberally and repeatedly throughout the day.  Protect yourself by wearing long sleeves, pants, a wide-brimmed hat, and sunglasses whenever you are outside.  Heart disease, diabetes, and high blood pressure  High blood pressure causes heart disease and increases the risk of stroke. High blood pressure is more likely to develop in: ? People who have blood pressure in the high end of  the normal range (130-139/85-89 mm Hg). ? People who are overweight or obese. ? People who are African American.  If you are 21-29 years of age, have your blood pressure checked every 3-5 years. If you are 3 years of age or older, have your blood pressure checked every year. You should have your blood pressure measured twice-once when you are at a hospital or clinic, and once when you are not at a hospital or clinic. Record the average of the two measurements. To check your blood pressure when you are not at a hospital or clinic, you can use: ? An automated blood pressure machine at a pharmacy. ? A home blood pressure monitor.  If you are between 17 years and 37 years old, ask your health care provider if you should take aspirin to prevent strokes.  Have regular diabetes screenings. This involves taking a blood sample to check your fasting blood sugar level. ? If you are at a normal weight and have a low risk for diabetes,  have this test once every three years after 44 years of age. ? If you are overweight and have a high risk for diabetes, consider being tested at a younger age or more often. Preventing infection Hepatitis B  If you have a higher risk for hepatitis B, you should be screened for this virus. You are considered at high risk for hepatitis B if: ? You were born in a country where hepatitis B is common. Ask your health care provider which countries are considered high risk. ? Your parents were born in a high-risk country, and you have not been immunized against hepatitis B (hepatitis B vaccine). ? You have HIV or AIDS. ? You use needles to inject street drugs. ? You live with someone who has hepatitis B. ? You have had sex with someone who has hepatitis B. ? You get hemodialysis treatment. ? You take certain medicines for conditions, including cancer, organ transplantation, and autoimmune conditions.  Hepatitis C  Blood testing is recommended for: ? Everyone born from [birth year] through 1963. ? Anyone with known risk factors for hepatitis C.  Sexually transmitted infections (STIs)  You should be screened for sexually transmitted infections (STIs) including gonorrhea and chlamydia if: ? You are sexually active and are younger than 44 years of age. ? You are older than 44 years of age and your health care provider tells you that you are at risk for this type of infection. ? Your sexual activity has changed since you were last screened and you are at an increased risk for chlamydia or gonorrhea. Ask your health care provider if you are at risk.  If you do not have HIV, but are at risk, it may be recommended that you take a prescription medicine daily to prevent HIV infection. This is called pre-exposure prophylaxis (PrEP). You are considered at risk if: ? You are sexually active and do not regularly use condoms or know the HIV status of your partner(s). ? You take drugs by injection. ? You are  sexually active with a partner who has HIV.  Talk with your health care provider about whether you are at high risk of being infected with HIV. If you choose to begin PrEP, you should first be tested for HIV. You should then be tested every 3 months for as long as you are taking PrEP. Pregnancy  If you are premenopausal and you may become pregnant, ask your health care provider about preconception counseling.  If you may become  pregnant, take 400 to 800 micrograms (mcg) of folic acid every day.  If you want to prevent pregnancy, talk to your health care provider about birth control (contraception). Osteoporosis and menopause  Osteoporosis is a disease in which the bones lose minerals and strength with aging. This can result in serious bone fractures. Your risk for osteoporosis can be identified using a bone density scan.  If you are 27 years of age or older, or if you are at risk for osteoporosis and fractures, ask your health care provider if you should be screened.  Ask your health care provider whether you should take a calcium or vitamin D supplement to lower your risk for osteoporosis.  Menopause may have certain physical symptoms and risks.  Hormone replacement therapy may reduce some of these symptoms and risks. Talk to your health care provider about whether hormone replacement therapy is right for you. Follow these instructions at home:  Schedule regular health, dental, and eye exams.  Stay current with your immunizations.  Do not use any tobacco products including cigarettes, chewing tobacco, or electronic cigarettes.  If you are pregnant, do not drink alcohol.  If you are breastfeeding, limit how much and how often you drink alcohol.  Limit alcohol intake to no more than 1 drink per day for nonpregnant women. One drink equals 12 ounces of beer, 5 ounces of wine, or 1 ounces of hard liquor.  Do not use street drugs.  Do not share needles.  Ask your health care  provider for help if you need support or information about quitting drugs.  Tell your health care provider if you often feel depressed.  Tell your health care provider if you have ever been abused or do not feel safe at home. This information is not intended to replace advice given to you by your health care provider. Make sure you discuss any questions you have with your health care provider. Document Released: 02/01/2011 Document Revised: 12/25/2015 Document Reviewed: 04/22/2015 Elsevier Interactive Patient Education  2018 [employer].   Please help [place] help you:  We are honored you have chosen [facility] for your Primary Care home. Below you will find basic instructions that you may need to access in the future. Please help [place] help you by reading the instructions, which cover many of the frequent questions we experience.   Prescription refills and request:  -In order to allow more efficient response time, please call your pharmacy for all refills. They will forward the request electronically to [place]. This allows for the quickest possible response. Request left on a nurse line can take longer to refill, since these are checked as time allows between office patients and other phone calls.  - refill request can take up to 3-5 working days to complete.  - If request is sent electronically and request is appropiate, it is usually completed in 1-2 business days.  - all patients will need to be seen routinely for all chronic medical conditions requiring prescription medications (see follow-up below). If you are overdue for follow up on your condition, you will be asked to make an appointment and we will call in enough medication to cover you until your appointment (up to 30 days).  - all controlled substances will require a face to face visit to request/refill.  - if you desire your prescriptions to go through a new pharmacy, and have an active script at original pharmacy, you will need to call  your pharmacy and have scripts transferred to  new pharmacy. This is completed between the pharmacy locations and not by your provider.    Results: If any images or labs were ordered, it can take up to 1 week to get results depending on the test ordered and the lab/facility running and resulting the test. - Normal or stable results, which do not need further discussion, may be released to your mychart immediately with attached note to you. A call may not be generated for normal results. Please make certain to sign up for mychart. If you have questions on how to activate your mychart you can call the front office.  - If your results need further discussion, our office will attempt to contact you via phone, and if unable to reach you after 2 attempts, we will release your abnormal result to your mychart with instructions.  - All results will be automatically released in mychart after 1 week.  - Your provider will provide you with explanation and instruction on all relevant material in your results. Please keep in mind, results and labs may appear confusing or abnormal to the untrained eye, but it does not mean they are actually abnormal for you personally. If you have any questions about your results that are not covered, or you desire more detailed explanation than what was provided, you should make an appointment with your provider to do so.   Our office handles many outgoing and incoming calls daily. If we have not contacted you within 1 week about your results, please check your mychart to see if there is a message first and if not, then contact our office.  In helping with this matter, you help decrease call volume, and therefore allow [place] to be able to respond to patients needs more efficiently.   Acute office visits (sick visit):  An acute visit is intended for a new problem and are scheduled in shorter time slots to allow schedule openings for patients with new problems. This is the appropriate visit  to discuss a new problem. In order to provide you with excellent quality medical care with proper time for you to explain your problem, have an exam and receive treatment with instructions, these appointments should be limited to one new problem per visit. If you experience a new problem, in which you desire to be addressed, please make an acute office visit, we save openings on the schedule to accommodate you. Please do not save your new problem for any other type of visit, let us take care of it properly and quickly for you.   Follow up visits:  Depending on your condition(s) your provider will need to see you routinely in order to provide you with quality care and prescribe medication(s). Most chronic conditions (Example: hypertension, Diabetes, depression/anxiety... etc), require visits a couple times a year. Your provider will instruct you on proper follow up for your personal medical conditions and history. Please make certain to make follow up appointments for your condition as instructed. Failing to do so could result in lapse in your medication treatment/refills. If you request a refill, and are overdue to be seen on a condition, we will always provide you with a 30 day script (once) to allow you time to schedule.    Medicare wellness (well visit): - we have a wonderful Nurse Maudie Mercury), that will meet with you and provide you will yearly medicare wellness visits. These visits should occur yearly (can not be scheduled less than 1 calendar year apart) and cover preventive health, immunizations, advance directives and  screenings you are entitled to yearly through your medicare benefits. Do not miss out on your entitled benefits, this is when medicare will pay for these benefits to be ordered for you.  These are strongly encouraged by your provider and is the appropriate type of visit to make certain you are up to date with all preventive health benefits. If you have not had your medicare wellness exam in  the last 12 months, please make certain to schedule one by calling the office and schedule your medicare wellness with Maudie Mercury as soon as possible.   Yearly physical (well visit):  - Adults are recommended to be seen yearly for physicals. Check with your insurance and date of your last physical, most insurances require one calendar year between physicals. Physicals include all preventive health topics, screenings, medical exam and labs that are appropriate for gender/age and history. You may have fasting labs needed at this visit. This is a well visit (not a sick visit), new problems should not be covered during this visit (see acute visit).  - Pediatric patients are seen more frequently when they are younger. Your provider will advise you on well child visit timing that is appropriate for your their age. - This is not a medicare wellness visit. Medicare wellness exams do not have an exam portion to the visit. Some medicare companies allow for a physical, some do not allow a yearly physical. If your medicare allows a yearly physical you can schedule the medicare wellness with our nurse Maudie Mercury and have your physical with your provider after, on the same day. Please check with insurance for your full benefits.   Late Policy/No Shows:  - all new patients should arrive 15-30 minutes earlier than appointment to allow [place] time  to  obtain all personal demographics,  insurance information and for you to complete office paperwork. - All established patients should arrive 10-15 minutes earlier than appointment time to update all information and be checked in .  - In our best efforts to run on time, if you are late for your appointment you will be asked to either reschedule or if able, we will work you back into the schedule. There will be a wait time to work you back in the schedule,  depending on availability.  - If you are unable to make it to your appointment as scheduled, please call 24 hours ahead of time to allow [place]  to fill the time slot with someone else who needs to be seen. If you do not cancel your appointment ahead of time, you may be charged a no show fee.

## 2017-04-28 LAB — HM COLONOSCOPY

## 2017-04-29 ENCOUNTER — Encounter: Payer: Self-pay | Admitting: *Deleted

## 2017-06-16 ENCOUNTER — Encounter: Payer: Self-pay | Admitting: Family Medicine

## 2017-06-16 ENCOUNTER — Ambulatory Visit (INDEPENDENT_AMBULATORY_CARE_PROVIDER_SITE_OTHER): Payer: Managed Care, Other (non HMO) | Admitting: Family Medicine

## 2017-06-16 VITALS — BP 115/68 | HR 69 | Temp 98.1°F | Resp 20 | Ht 70.0 in | Wt 175.0 lb

## 2017-06-16 DIAGNOSIS — E785 Hyperlipidemia, unspecified: Secondary | ICD-10-CM

## 2017-06-16 DIAGNOSIS — E034 Atrophy of thyroid (acquired): Secondary | ICD-10-CM | POA: Diagnosis not present

## 2017-06-16 DIAGNOSIS — R030 Elevated blood-pressure reading, without diagnosis of hypertension: Secondary | ICD-10-CM | POA: Diagnosis not present

## 2017-06-16 DIAGNOSIS — Z23 Encounter for immunization: Secondary | ICD-10-CM | POA: Diagnosis not present

## 2017-06-16 NOTE — Patient Instructions (Addendum)
Your blood pressure looks great. Keep up the good work.  Hope you have happy holidays.   Follow up at physical at end of June.   Please help [place] help you:  We are honored you have chosen [facility] for your Primary Care home. Below you will find basic instructions that you may need to access in the future. Please help [place] help you by reading the instructions, which cover many of the frequent questions we experience.   Prescription refills and request:  -In order to allow more efficient response time, please call your pharmacy for all refills. They will forward the request electronically to [place]. This allows for the quickest possible response. Request left on a nurse line can take longer to refill, since these are checked as time allows between office patients and other phone calls.  - refill request can take up to 3-5 working days to complete.  - If request is sent electronically and request is appropiate, it is usually completed in 1-2 business days.  - all patients will need to be seen routinely for all chronic medical conditions requiring prescription medications (see follow-up below). If you are overdue for follow up on your condition, you will be asked to make an appointment and we will call in enough medication to cover you until your appointment (up to 30 days).  - all controlled substances will require a face to face visit to request/refill.  - if you desire your prescriptions to go through a new pharmacy, and have an active script at original pharmacy, you will need to call your pharmacy and have scripts transferred to new pharmacy. This is completed between the pharmacy locations and not by your provider.    Results: If any images or labs were ordered, it can take up to 1 week to get results depending on the test ordered and the lab/facility running and resulting the test. - Normal or stable results, which do not need further discussion, may be released to your mychart immediately with  attached note to you. A call may not be generated for normal results. Please make certain to sign up for mychart. If you have questions on how to activate your mychart you can call the front office.  - If your results need further discussion, our office will attempt to contact you via phone, and if unable to reach you after 2 attempts, we will release your abnormal result to your mychart with instructions.  - All results will be automatically released in mychart after 1 week.  - Your provider will provide you with explanation and instruction on all relevant material in your results. Please keep in mind, results and labs may appear confusing or abnormal to the untrained eye, but it does not mean they are actually abnormal for you personally. If you have any questions about your results that are not covered, or you desire more detailed explanation than what was provided, you should make an appointment with your provider to do so.   Our office handles many outgoing and incoming calls daily. If we have not contacted you within 1 week about your results, please check your mychart to see if there is a message first and if not, then contact our office.  In helping with this matter, you help decrease call volume, and therefore allow [place] to be able to respond to patients needs more efficiently.   Acute office visits (sick visit):  An acute visit is intended for a new problem and are scheduled in  shorter time slots to allow schedule openings for patients with new problems. This is the appropriate visit to discuss a new problem. In order to provide you with excellent quality medical care with proper time for you to explain your problem, have an exam and receive treatment with instructions, these appointments should be limited to one new problem per visit. If you experience a new problem, in which you desire to be addressed, please make an acute office visit, we save openings on the schedule to accommodate you. Please do  not save your new problem for any other type of visit, let us take care of it properly and quickly for you.   Follow up visits:  Depending on your condition(s) your provider will need to see you routinely in order to provide you with quality care and prescribe medication(s). Most chronic conditions (Example: hypertension, Diabetes, depression/anxiety... etc), require visits a couple times a year. Your provider will instruct you on proper follow up for your personal medical conditions and history. Please make certain to make follow up appointments for your condition as instructed. Failing to do so could result in lapse in your medication treatment/refills. If you request a refill, and are overdue to be seen on a condition, we will always provide you with a 30 day script (once) to allow you time to schedule.    Medicare wellness (well visit): - we have a wonderful Nurse Maudie Mercury), that will meet with you and provide you will yearly medicare wellness visits. These visits should occur yearly (can not be scheduled less than 1 calendar year apart) and cover preventive health, immunizations, advance directives and screenings you are entitled to yearly through your medicare benefits. Do not miss out on your entitled benefits, this is when medicare will pay for these benefits to be ordered for you.  These are strongly encouraged by your provider and is the appropriate type of visit to make certain you are up to date with all preventive health benefits. If you have not had your medicare wellness exam in the last 12 months, please make certain to schedule one by calling the office and schedule your medicare wellness with Maudie Mercury as soon as possible.   Yearly physical (well visit):  - Adults are recommended to be seen yearly for physicals. Check with your insurance and date of your last physical, most insurances require one calendar year between physicals. Physicals include all preventive health topics, screenings, medical  exam and labs that are appropriate for gender/age and history. You may have fasting labs needed at this visit. This is a well visit (not a sick visit), new problems should not be covered during this visit (see acute visit).  - Pediatric patients are seen more frequently when they are younger. Your provider will advise you on well child visit timing that is appropriate for your their age. - This is not a medicare wellness visit. Medicare wellness exams do not have an exam portion to the visit. Some medicare companies allow for a physical, some do not allow a yearly physical. If your medicare allows a yearly physical you can schedule the medicare wellness with our nurse Maudie Mercury and have your physical with your provider after, on the same day. Please check with insurance for your full benefits.   Late Policy/No Shows:  - all new patients should arrive 15-30 minutes earlier than appointment to allow [place] time  to  obtain all personal demographics,  insurance information and for you to complete office paperwork. - All established patients  should arrive 10-15 minutes earlier than appointment time to update all information and be checked in .  - In our best efforts to run on time, if you are late for your appointment you will be asked to either reschedule or if able, we will work you back into the schedule. There will be a wait time to work you back in the schedule,  depending on availability.  - If you are unable to make it to your appointment as scheduled, please call 24 hours ahead of time to allow [place] to fill the time slot with someone else who needs to be seen. If you do not cancel your appointment ahead of time, you may be charged a no show fee.

## 2017-06-16 NOTE — Progress Notes (Signed)
Ann Cain , 08/03/1970, 46 y.o., female MRN: [id number] Patient Care Team    Relationship Specialty Notifications Start End  Ann Cain PCP - General Family Medicine  10/30/15   Ann Cain Consulting Physician Obstetrics and Gynecology  10/30/15   Ann Cain Consulting Physician Gastroenterology  01/14/17     Chief Complaint  Patient presents with  . Hypertension     Subjective:  Elevated BP/hyperlipidemia: BP ranges at home are all in 120/70-80. She last checked 2 weeks ago. She has been eating a low-sodium diet. She has been exercising very routinely and this kept the majority of the 20 pound weight loss off. Her last A1c was 5.5, normalized. She continues to take the fish oil supplementation and lipids had improved on last visit although not normal. TSH collected 12/21/2016 was also within normal limits. She is feeling well.   Depression screen [facility] 2/9 01/14/2017 12/22/2015 12/17/2015  Decreased Interest 0 0 0  Down, Depressed, Hopeless 0 0 0  PHQ - 2 Score 0 0 0    No Known Allergies Social History   Tobacco Use  . Smoking status: Never Smoker  . Smokeless tobacco: Never Used  Substance Use Topics  . Alcohol use: No   Past Medical History:  Diagnosis Date  . Acid reflux 2012   egd  . Allergic rhinitis    [place] allergy/asthma patient  . Allergy   . Anal fissure   . Anemia    prior to hysterectomy   . Asthma   . Colon polyp 2015   tubular adenoma  . Dysrhythmia   . Elevated hemoglobin A1c   . Fibroid uterus   . 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 Hemorrhoid   . History of chicken pox   . History of dizziness   . Hypertension   . Hypothyroidism   . Migraines   . Rectal bleeding    Past Surgical History:  Procedure Laterality Date  . 48-Hour Cardiac Event Monitor  01/2016   Normal study. Sinus rhythm with artifact. No PACs, PVCs or arrhythmias.  . ABDOMINAL HYSTERECTOMY    . CESAREAN SECTION  2002, 2004  . CHOLECYSTECTOMY N/A  02/19/2016   Procedure: LAPAROSCOPIC CHOLECYSTECTOMY;  Surgeon: Ann Cain;  Location: [facility];  Service: General;  Laterality: N/A;  . COLONOSCOPY  2015   Dr. Claudie Cain internal hemorrhoids, 2 polyps  . DENTAL SURGERY    . TRANSTHORACIC ECHOCARDIOGRAM  01/21/2016   EF 5560%. No RWMA. Normal Valves. No MVP. No PFO/ASD  . Treadmill Stress Test  01/27/2016   Reached 10.10 METS  Hypertensive response to exercise. No ST deviation. No T-wave inversion. Baseline ST depressions in inferolateral leads remain the same distress.  NEGATIVE GXT   Family History  Problem Relation Age of Onset  . Kidney cancer Father 34  . Lung cancer Maternal Grandfather   . Colon cancer Paternal Grandmother 69  . Heart disease Paternal Grandmother   . Heart disease Paternal Grandfather    Allergies as of 06/16/2017   No Known Allergies     Medication List        Accurate as of 06/16/17 12:32 PM. Always use your most recent med list.          levothyroxine 100 MCG tablet Commonly known as:  SYNTHROID, LEVOTHROID Take 1 tablet (100 mcg total) by mouth daily before breakfast.   lidocaine-prilocaine cream Commonly known as:  EMLA APPLY ONCE TOPICALLY AS DIRECTED   PROAIR  RESPICLICK 384 (90 Base) MCG/ACT Aepb Generic drug:  Albuterol Sulfate Inhale 2 puffs into the lungs every 6 (six) hours as needed (wheezing).   tretinoin 0.025 % cream Commonly known as:  RETIN-A APPLY 1 APPLICATION A PEARL-SIZED AMOUNT TO FACE IN THE EVENING ONCE A DAY   valACYclovir 500 MG tablet Commonly known as:  VALTREX TAKE 1 TABLET BY MOUTH DAILY AS PREVENTION, INCREASE TO TWICE A DAY FOR 3 DAYS WITH LESIONS       All past medical history, surgical history, allergies, family history, immunizations andmedications were updated in the EMR today and reviewed under the history and medication portions of their EMR.     ROS: Negative, with the exception of above mentioned in HPI   Objective:  BP 115/68 (BP Location:  Right Arm, Patient Position: Sitting, Cuff Size: Normal)   Pulse 69   Temp 98.1 F (36.7 C)   Resp 20   Ht 5\' 10"  (1.778 m)   Wt 175 lb (79.4 kg)   SpO2 98%   BMI 25.11 kg/m  Body mass index is 25.11 kg/m. Gen: Afebrile. No acute distress. Nontoxic in appearance, well developed, well nourished.  HENT: AT. [place].  MMM, no oral lesions.  Eyes:Pupils Equal Round Reactive to light, Extraocular movements intact,  Conjunctiva without redness, discharge or icterus. CV: RRR no murmur appreciated today, no edema Chest: CTAB, no wheeze or crackles.  Abd: Soft. NTND. BS present.  Neuro:  Normal gait. PERLA. EOMi. Alert. Oriented x3  No exam data present No results found. No results found for this or any previous visit (from the past 24 hour(s)).  Assessment/Plan: [name] is a 46 y.o. female present for OV for  Hypothyroidism due to acquired atrophy of thyroid Well-controlled will continue to follow yearly. Patient reports compliance with levothyroxine 100 g daily. Hyperlipidemia LDL goal <100 Continue fish oil supplementation, repeat fasting labs during physical. Elevated BP without diagnosis of hypertension Blood pressure has been normal the last 2 visits since she has lost her weight. She can continue to monitor at home occasionally. If above 135/85 with a need to see her for her blood pressure otherwise we'll monitor normal visits. Influenza vaccine administered - Flu Vaccine QUAD 6+ mos PF IM (Fluarix Quad PF)   Reviewed expectations re: course of current medical issues.  Discussed self-management of symptoms.  Outlined signs and symptoms indicating need for more acute intervention.  Patient verbalized understanding and all questions were answered.  Patient received an After-Visit Summary.    Orders Placed This Encounter  Procedures  . Flu Vaccine QUAD 6+ mos PF IM (Fluarix Quad PF)    Note is dictated utilizing voice recognition software. Although note has been  proof read prior to signing, occasional typographical errors still can be missed. If any questions arise, please Cain not hesitate to call for verification.   electronically signed by:  Howard Pouch, Cain  [place]

## 2017-06-17 ENCOUNTER — Telehealth: Payer: Self-pay | Admitting: Family Medicine

## 2017-06-17 DIAGNOSIS — E785 Hyperlipidemia, unspecified: Secondary | ICD-10-CM

## 2017-06-17 DIAGNOSIS — E039 Hypothyroidism, unspecified: Secondary | ICD-10-CM

## 2017-06-17 NOTE — Telephone Encounter (Signed)
-----   Message from Leota Jacobsen, LPN sent at 42/05/3127 12:55 PM EST ----- Regarding: FW: lab orders.  Did you want labs ordered prior to CPE or draw at visit? ----- Message ----- From: Ralph Dowdy, CMA Sent: 06/16/2017  10:04 AM To: Leota Jacobsen, LPN Subject: lab orders.                                    Patient wants to have labs prior to CPE.  Lab and CPE are scheduled in June.  Please place lab orders.   THank you!

## 2017-06-17 NOTE — Telephone Encounter (Signed)
Please order labs.  Cbc, cmp, tsh, a1c, lipid--> DX codes: hypothyroid and hyperlipidemia

## 2017-10-18 IMAGING — US US ABDOMEN COMPLETE
1 series · 14 of 25 positions shown · non-contrast
Comparison: None.

CLINICAL DATA: RIGHT upper quadrant pain.  Abdominal pain

EXAM:
ABDOMEN ULTRASOUND COMPLETE

[Series 1: us abdomen complete · 0.15mm/px · 14 of 77 slices shown]
[im 1/77]
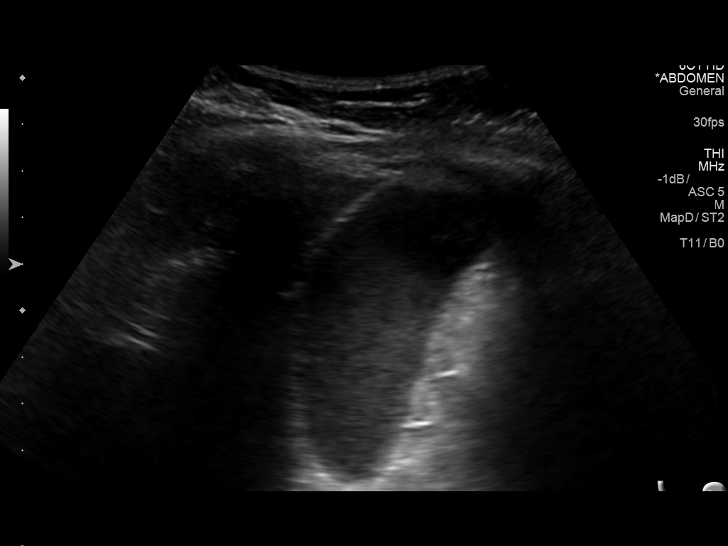
[im 7/77]
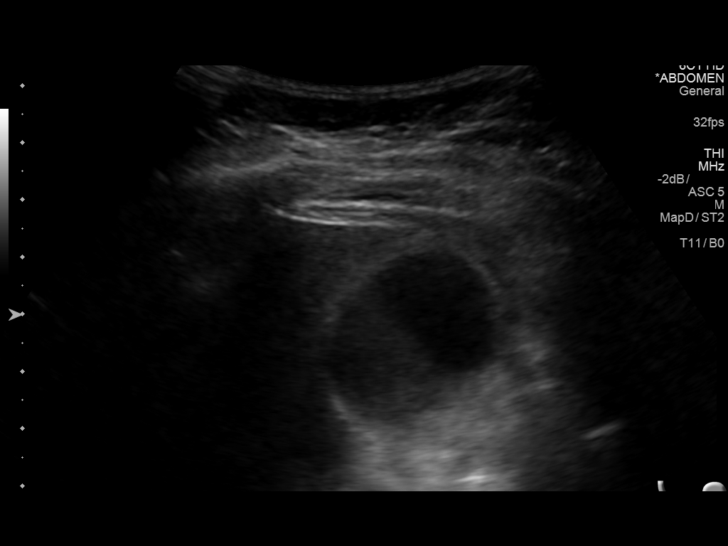
[im 13/77]
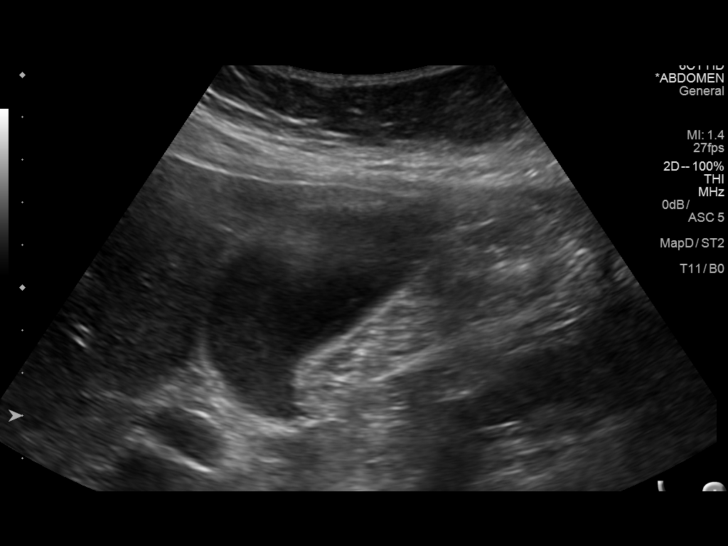
[im 20/77]
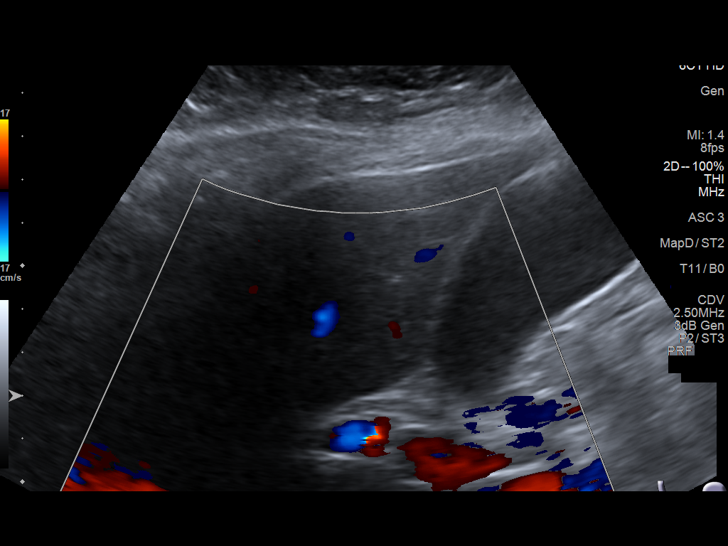
[im 26/77]
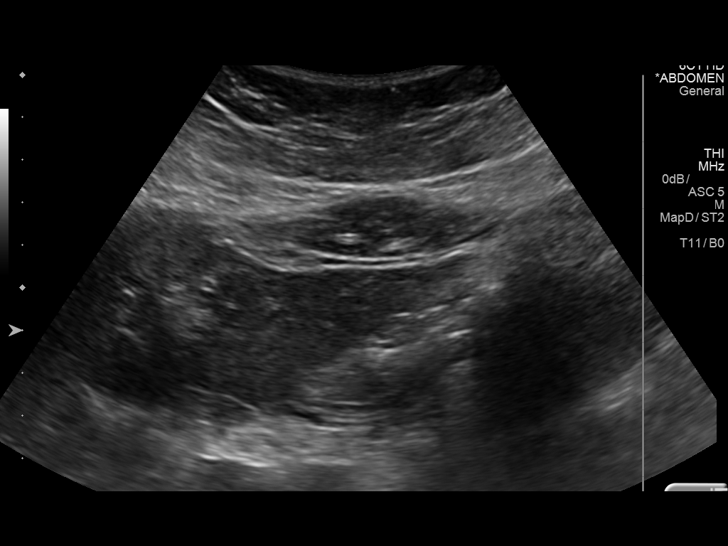
[im 29/77]
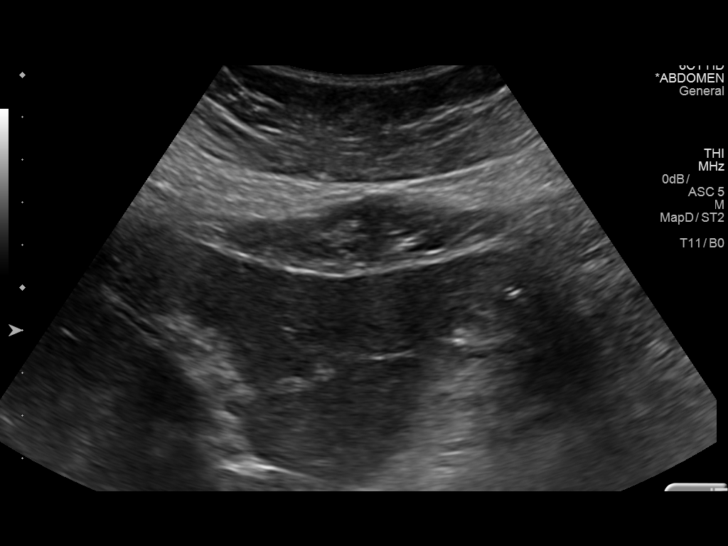
[im 35/77]
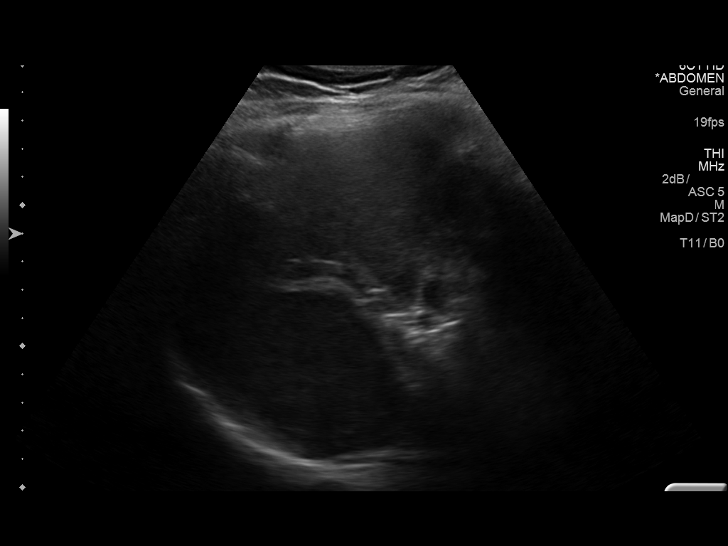
[im 42/77]
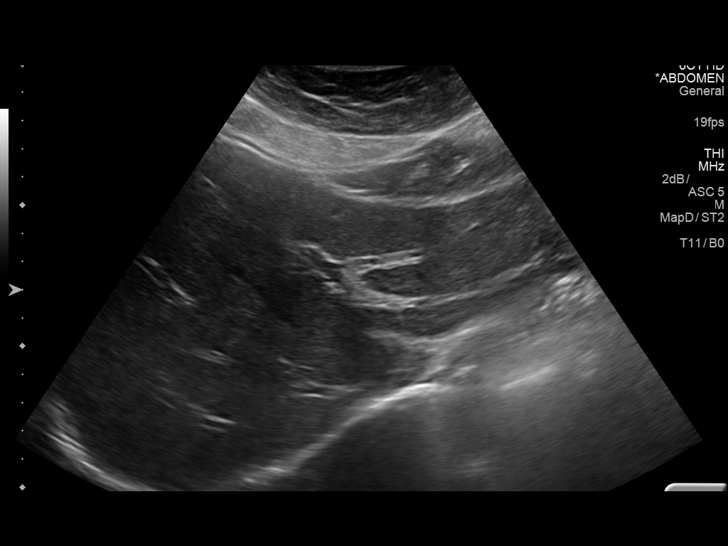
[im 48/77]
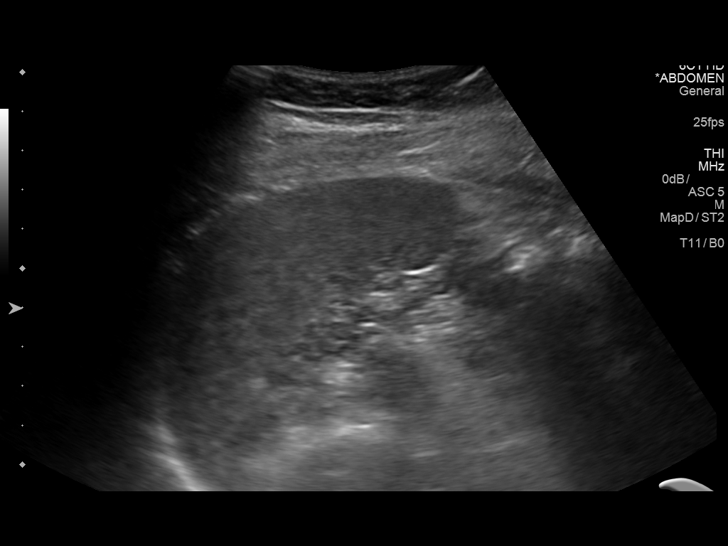
[im 51/77]
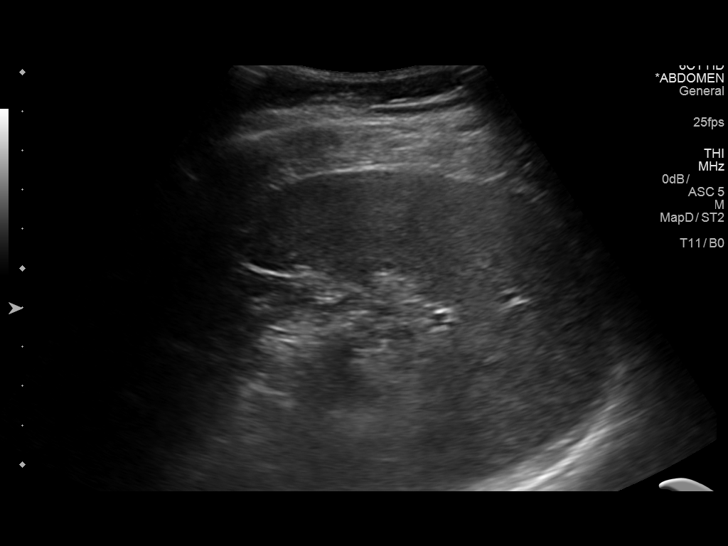
[im 58/77]
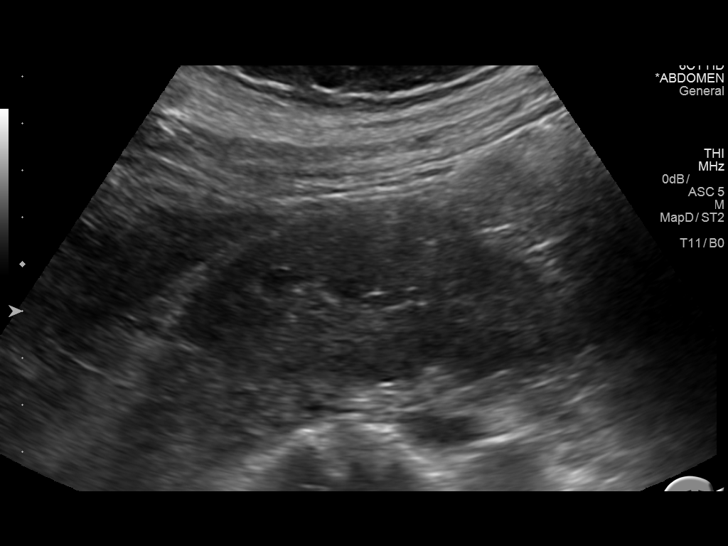
[im 64/77]
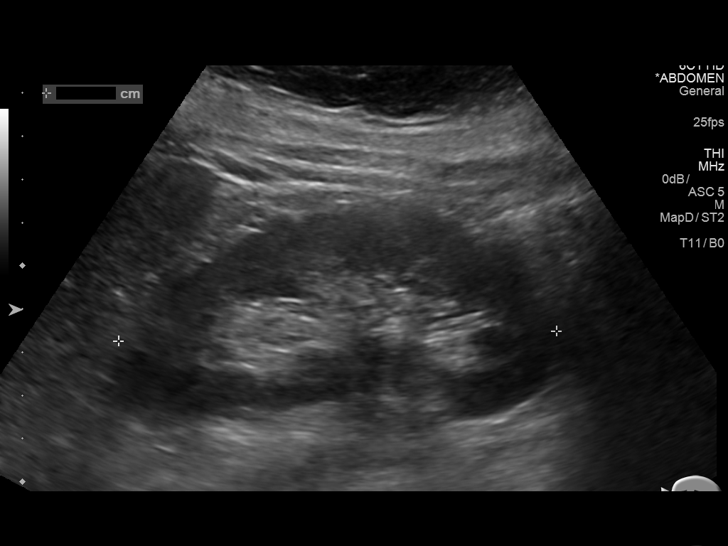
[im 70/77]
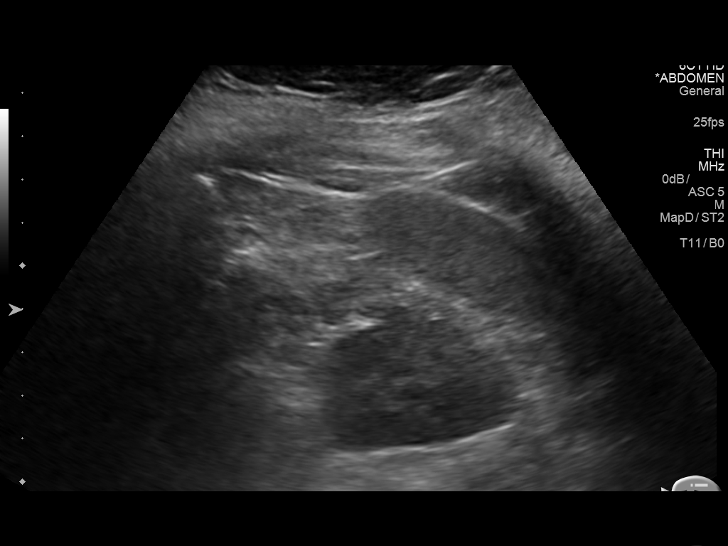
[im 77/77]
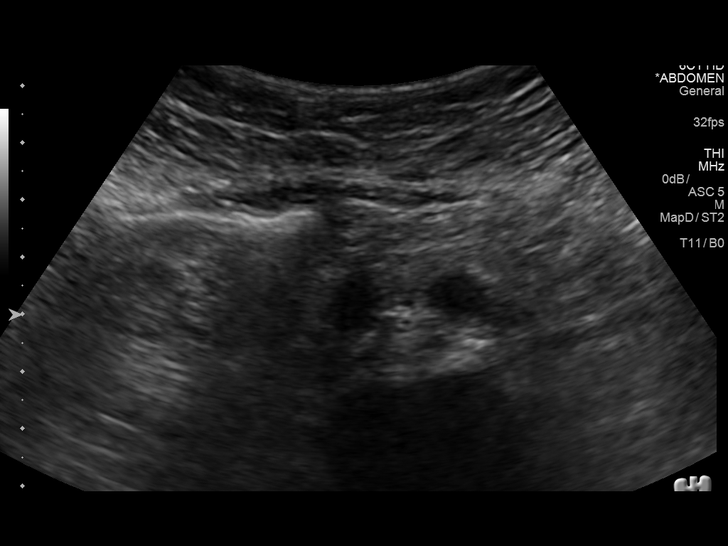

[14 of 25 positions shown; findings below may reference images not displayed]

FINDINGS: Gallbladder: The gallbladder is filled with homogeneous echogenic
material suggesting sludge. No echogenic foci of calcification. No
gallbladder wall thickening or pericholecystic fluid

Common bile duct: Diameter: Normal at 3 mm

Liver: No focal lesion identified. Within normal limits in
parenchymal echogenicity.

IVC: No abnormality visualized.

Pancreas: Visualized portion unremarkable.

Spleen: Size and appearance within normal limits.

Right Kidney: Length: 10.8 cm. Echogenicity within normal limits. No
mass or hydronephrosis visualized.

Left Kidney: Length: 10.1 cm. Echogenicity within normal limits. No
mass or hydronephrosis visualized.

Abdominal aorta: No aneurysm visualized.

Other findings: None.
IMPRESSION: 1. Sludge within the gallbladder.  No evidence acute cholecystitis.
2. Normal liver and kidneys.

## 2018-01-11 ENCOUNTER — Other Ambulatory Visit: Payer: Self-pay

## 2018-01-11 MED ORDER — LEVOTHYROXINE SODIUM 100 MCG PO TABS: 100.0000 ug | ORAL_TABLET | Freq: Every day | ORAL | 0 refills | Status: DC

## 2018-01-19 ENCOUNTER — Other Ambulatory Visit: Payer: Managed Care, Other (non HMO)

## 2018-01-23 ENCOUNTER — Encounter: Payer: Managed Care, Other (non HMO) | Admitting: Family Medicine

## 2018-02-13 ENCOUNTER — Encounter: Payer: Managed Care, Other (non HMO) | Admitting: Family Medicine

## 2018-02-14 ENCOUNTER — Encounter: Payer: Self-pay | Admitting: *Deleted

## 2018-02-14 ENCOUNTER — Other Ambulatory Visit: Payer: Self-pay | Admitting: *Deleted

## 2018-02-14 MED ORDER — LEVOTHYROXINE SODIUM 100 MCG PO TABS: 100.0000 ug | ORAL_TABLET | Freq: Every day | ORAL | 0 refills | Status: DC

## 2018-02-23 ENCOUNTER — Other Ambulatory Visit (INDEPENDENT_AMBULATORY_CARE_PROVIDER_SITE_OTHER): Payer: BLUE CROSS/BLUE SHIELD

## 2018-02-23 DIAGNOSIS — E039 Hypothyroidism, unspecified: Secondary | ICD-10-CM | POA: Diagnosis not present

## 2018-02-23 DIAGNOSIS — E785 Hyperlipidemia, unspecified: Secondary | ICD-10-CM | POA: Diagnosis not present

## 2018-02-23 LAB — COMPREHENSIVE METABOLIC PANEL
ALBUMIN: 4.2 g/dL (ref 3.5–5.2)
ALT: 20 U/L (ref 0–35)
AST: 14 U/L (ref 0–37)
Alkaline Phosphatase: 40 U/L (ref 39–117)
BUN: 23 mg/dL (ref 6–23)
CALCIUM: 9.1 mg/dL (ref 8.4–10.5)
CHLORIDE: 105 meq/L (ref 96–112)
CO2: 27 meq/L (ref 19–32)
CREATININE: 0.91 mg/dL (ref 0.40–1.20)
GFR: 70.95 mL/min (ref >60.00)
Glucose, Bld: 102 mg/dL — ABNORMAL HIGH (ref 70–99)
POTASSIUM: 4.2 meq/L (ref 3.5–5.1)
Sodium: 137 mEq/L (ref 135–145)
Total Bilirubin: 0.5 mg/dL (ref 0.2–1.2)
Total Protein: 6.7 g/dL (ref 6.0–8.3)

## 2018-02-23 LAB — CBC WITH DIFFERENTIAL/PLATELET
BASOS PCT: 0.8 % (ref 0.0–3.0)
Basophils Absolute: 0 10*3/uL (ref 0.0–0.1)
EOS ABS: 0.2 10*3/uL (ref 0.0–0.7)
EOS PCT: 3.2 % (ref 0.0–5.0)
HEMATOCRIT: 42.2 % (ref 36.0–46.0)
HEMOGLOBIN: 14.1 g/dL (ref 12.0–15.0)
LYMPHS PCT: 22 % (ref 12.0–46.0)
Lymphs Abs: 1.1 10*3/uL (ref 0.7–4.0)
MCHC: 33.4 g/dL (ref 30.0–36.0)
MCV: 94.6 fl (ref 78.0–100.0)
MONOS PCT: 10.8 % (ref 3.0–12.0)
Monocytes Absolute: 0.5 10*3/uL (ref 0.1–1.0)
Neutro Abs: 3.2 10*3/uL (ref 1.4–7.7)
Neutrophils Relative %: 63.2 % (ref 43.0–77.0)
Platelets: 211 10*3/uL (ref 150.0–400.0)
RBC: 4.46 Mil/uL (ref 3.87–5.11)
RDW: 12.8 % (ref 11.5–15.5)
WBC: 5 10*3/uL (ref 4.0–10.5)

## 2018-02-23 LAB — LIPID PANEL
CHOL/HDL RATIO: 4
Cholesterol: 218 mg/dL — ABNORMAL HIGH (ref 0–200)
HDL: 55 mg/dL (ref >39.00)
LDL CALC: 139 mg/dL — AB (ref 0–99)
NONHDL: 163.43
Triglycerides: 121 mg/dL (ref 0.0–149.0)
VLDL: 24.2 mg/dL (ref 0.0–40.0)

## 2018-02-23 LAB — HEMOGLOBIN A1C: HEMOGLOBIN A1C: 5.8 % (ref 4.6–6.5)

## 2018-02-23 LAB — TSH: TSH: 2 u[IU]/mL (ref 0.35–4.50)

## 2018-02-27 ENCOUNTER — Encounter: Payer: Self-pay | Admitting: Family Medicine

## 2018-02-27 ENCOUNTER — Ambulatory Visit (INDEPENDENT_AMBULATORY_CARE_PROVIDER_SITE_OTHER): Payer: BLUE CROSS/BLUE SHIELD | Admitting: Family Medicine

## 2018-02-27 VITALS — BP 127/89 | HR 71 | Temp 98.3°F | Resp 20 | Ht 70.0 in | Wt 186.0 lb

## 2018-02-27 DIAGNOSIS — E039 Hypothyroidism, unspecified: Secondary | ICD-10-CM | POA: Diagnosis not present

## 2018-02-27 DIAGNOSIS — E785 Hyperlipidemia, unspecified: Secondary | ICD-10-CM

## 2018-02-27 DIAGNOSIS — G43809 Other migraine, not intractable, without status migrainosus: Secondary | ICD-10-CM | POA: Diagnosis not present

## 2018-02-27 DIAGNOSIS — G43909 Migraine, unspecified, not intractable, without status migrainosus: Secondary | ICD-10-CM | POA: Insufficient documentation

## 2018-02-27 DIAGNOSIS — E663 Overweight: Secondary | ICD-10-CM | POA: Insufficient documentation

## 2018-02-27 DIAGNOSIS — Z0000 Encounter for general adult medical examination without abnormal findings: Secondary | ICD-10-CM | POA: Diagnosis not present

## 2018-02-27 MED ORDER — LEVOTHYROXINE SODIUM 100 MCG PO TABS: 100.0000 ug | ORAL_TABLET | Freq: Every day | ORAL | 3 refills | Status: DC

## 2018-02-27 NOTE — Patient Instructions (Addendum)
I will refer you to neurology for your headaches.    Health Maintenance, Female Adopting a healthy lifestyle and getting preventive care can go a long way to promote health and wellness. Talk with your health care provider about what schedule of regular examinations is right for you. This is a good chance for you to check in with your provider about disease prevention and staying healthy. In between checkups, there are plenty of things you can do on your own. Experts have done a lot of research about which lifestyle changes and preventive measures are most likely to keep you healthy. Ask your health care provider for more information. Weight and diet Eat a healthy diet  Be sure to include plenty of vegetables, fruits, low-fat dairy products, and lean protein.  Do not eat a lot of foods high in solid fats, added sugars, or salt.  Get regular exercise. This is one of the most important things you can do for your health. ? Most adults should exercise for at least 150 minutes each week. The exercise should increase your heart rate and make you sweat (moderate-intensity exercise). ? Most adults should also do strengthening exercises at least twice a week. This is in addition to the moderate-intensity exercise.  Maintain a healthy weight  Body mass index (BMI) is a measurement that can be used to identify possible weight problems. It estimates body fat based on height and weight. Your health care provider can help determine your BMI and help you achieve or maintain a healthy weight.  For females 8 years of age and older: ? A BMI below 18.5 is considered underweight. ? A BMI of 18.5 to 24.9 is normal. ? A BMI of 25 to 29.9 is considered overweight. ? A BMI of 30 and above is considered obese.  Watch levels of cholesterol and blood lipids  You should start having your blood tested for lipids and cholesterol at 45 years of age, then have this test every 5 years.  You may need to have your  cholesterol levels checked more often if: ? Your lipid or cholesterol levels are high. ? You are older than 44 years of age. ? You are at high risk for heart disease.  Cancer screening Lung Cancer  Lung cancer screening is recommended for adults 9-65 years old who are at high risk for lung cancer because of a history of smoking.  A yearly low-dose CT scan of the lungs is recommended for people who: ? Currently smoke. ? Have quit within the past 15 years. ? Have at least a 30-pack-year history of smoking. A pack year is smoking an average of one pack of cigarettes a day for 1 year.  Yearly screening should continue until it has been 15 years since you quit.  Yearly screening should stop if you develop a health problem that would prevent you from having lung cancer treatment.  Breast Cancer  Practice breast self-awareness. This means understanding how your breasts normally appear and feel.  It also means doing regular breast self-exams. Let your health care provider know about any changes, no matter how small.  If you are in your 20s or 30s, you should have a clinical breast exam (CBE) by a health care provider every 1-3 years as part of a regular health exam.  If you are 38 or older, have a CBE every year. Also consider having a breast X-ray (mammogram) every year.  If you have a family history of breast cancer, talk to your health  provider about genetic screening.  If you are at high risk for breast cancer, talk to your health care provider about having an MRI and a mammogram every year.  Breast cancer gene (BRCA) assessment is recommended for women who have family members with BRCA-related cancers. BRCA-related cancers include: ? Breast. ? Ovarian. ? Tubal. ? Peritoneal cancers.  Results of the assessment will determine the need for genetic counseling and BRCA1 and BRCA2 testing.  Cervical Cancer Your health care provider may recommend that you be screened regularly for cancer of  the pelvic organs (ovaries, uterus, and vagina). This screening involves a pelvic examination, including checking for microscopic changes to the surface of your cervix (Pap test). You may be encouraged to have this screening done every 3 years, beginning at age 21.  For women ages 30-65, health care providers may recommend pelvic exams and Pap testing every 3 years, or they may recommend the Pap and pelvic exam, combined with testing for human papilloma virus (HPV), every 5 years. Some types of HPV increase your risk of cervical cancer. Testing for HPV may also be done on women of any age with unclear Pap test results.  Other health care providers may not recommend any screening for nonpregnant women who are considered low risk for pelvic cancer and who do not have symptoms. Ask your health care provider if a screening pelvic exam is right for you.  If you have had past treatment for cervical cancer or a condition that could lead to cancer, you need Pap tests and screening for cancer for at least 20 years after your treatment. If Pap tests have been discontinued, your risk factors (such as having a new sexual partner) need to be reassessed to determine if screening should resume. Some women have medical problems that increase the chance of getting cervical cancer. In these cases, your health care provider may recommend more frequent screening and Pap tests.  Colorectal Cancer  This type of cancer can be detected and often prevented.  Routine colorectal cancer screening usually begins at 45 years of age and continues through 45 years of age.  Your health care provider may recommend screening at an earlier age if you have risk factors for colon cancer.  Your health care provider may also recommend using home test kits to check for hidden blood in the stool.  A small camera at the end of a tube can be used to examine your colon directly (sigmoidoscopy or colonoscopy). This is done to check for the  earliest forms of colorectal cancer.  Routine screening usually begins at age 50.  Direct examination of the colon should be repeated every 5-10 years through 45 years of age. However, you may need to be screened more often if early forms of precancerous polyps or small growths are found.  Skin Cancer  Check your skin from head to toe regularly.  Tell your health care provider about any new moles or changes in moles, especially if there is a change in a mole's shape or color.  Also tell your health care provider if you have a mole that is larger than the size of a pencil eraser.  Always use sunscreen. Apply sunscreen liberally and repeatedly throughout the day.  Protect yourself by wearing long sleeves, pants, a wide-brimmed hat, and sunglasses whenever you are outside.  Heart disease, diabetes, and high blood pressure  High blood pressure causes heart disease and increases the risk of stroke. High blood pressure is more likely to develop in: ?   People who have blood pressure in the high end of the normal range (130-139/85-89 mm Hg). ? People who are overweight or obese. ? People who are African American.  If you are 18-39 years of age, have your blood pressure checked every 3-5 years. If you are 40 years of age or older, have your blood pressure checked every year. You should have your blood pressure measured twice-once when you are at a hospital or clinic, and once when you are not at a hospital or clinic. Record the average of the two measurements. To check your blood pressure when you are not at a hospital or clinic, you can use: ? An automated blood pressure machine at a pharmacy. ? A home blood pressure monitor.  If you are between 55 years and 79 years old, ask your health care provider if you should take aspirin to prevent strokes.  Have regular diabetes screenings. This involves taking a blood sample to check your fasting blood sugar level. ? If you are at a normal weight and  have a low risk for diabetes, have this test once every three years after 45 years of age. ? If you are overweight and have a high risk for diabetes, consider being tested at a younger age or more often. Preventing infection Hepatitis B  If you have a higher risk for hepatitis B, you should be screened for this virus. You are considered at high risk for hepatitis B if: ? You were born in a country where hepatitis B is common. Ask your health care provider which countries are considered high risk. ? Your parents were born in a high-risk country, and you have not been immunized against hepatitis B (hepatitis B vaccine). ? You have HIV or AIDS. ? You use needles to inject street drugs. ? You live with someone who has hepatitis B. ? You have had sex with someone who has hepatitis B. ? You get hemodialysis treatment. ? You take certain medicines for conditions, including cancer, organ transplantation, and autoimmune conditions.  Hepatitis C  Blood testing is recommended for: ? Everyone born from 1945 through 1965. ? Anyone with known risk factors for hepatitis C.  Sexually transmitted infections (STIs)  You should be screened for sexually transmitted infections (STIs) including gonorrhea and chlamydia if: ? You are sexually active and are younger than 45 years of age. ? You are older than 45 years of age and your health care provider tells you that you are at risk for this type of infection. ? Your sexual activity has changed since you were last screened and you are at an increased risk for chlamydia or gonorrhea. Ask your health care provider if you are at risk.  If you do not have HIV, but are at risk, it may be recommended that you take a prescription medicine daily to prevent HIV infection. This is called pre-exposure prophylaxis (PrEP). You are considered at risk if: ? You are sexually active and do not regularly use condoms or know the HIV status of your partner(s). ? You take drugs by  injection. ? You are sexually active with a partner who has HIV.  Talk with your health care provider about whether you are at high risk of being infected with HIV. If you choose to begin PrEP, you should first be tested for HIV. You should then be tested every 3 months for as long as you are taking PrEP. Pregnancy  If you are premenopausal and you may become pregnant, ask your health   your health care provider about preconception counseling.  If you may become pregnant, take 400 to 800 micrograms (mcg) of folic acid every day.  If you want to prevent pregnancy, talk to your health care provider about birth control (contraception). Osteoporosis and menopause  Osteoporosis is a disease in which the bones lose minerals and strength with aging. This can result in serious bone fractures. Your risk for osteoporosis can be identified using a bone density scan.  If you are 13 years of age or older, or if you are at risk for osteoporosis and fractures, ask your health care provider if you should be screened.  Ask your health care provider whether you should take a calcium or vitamin D supplement to lower your risk for osteoporosis.  Menopause may have certain physical symptoms and risks.  Hormone replacement therapy may reduce some of these symptoms and risks. Talk to your health care provider about whether hormone replacement therapy is right for you. Follow these instructions at home:  Schedule regular health, dental, and eye exams.  Stay current with your immunizations.  Do not use any tobacco products including cigarettes, chewing tobacco, or electronic cigarettes.  If you are pregnant, do not drink alcohol.  If you are breastfeeding, limit how much and how often you drink alcohol.  Limit alcohol intake to no more than 1 drink per day for nonpregnant women. One drink equals 12 ounces of beer, 5 ounces of wine, or 1 ounces of hard liquor.  Do not use street  drugs.  Do not share needles.  Ask your health care provider for help if you need support or information about quitting drugs.  Tell your health care provider if you often feel depressed.  Tell your health care provider if you have ever been abused or do not feel safe at home. This information is not intended to replace advice given to you by your health care provider. Make sure you discuss any questions you have with your health care provider. Document Released: 02/01/2011 Document Revised: 12/25/2015 Document Reviewed: 04/22/2015 [employer] Interactive Patient Education  [employer].

## 2018-02-27 NOTE — Progress Notes (Signed)
Patient ID: Ann Cain, female  DOB: October 03, 1970, 47 y.o.   MRN: [id number] Patient Care Team    Relationship Specialty Notifications Start End  Ma Hillock, DO PCP - General Family Medicine  10/30/15   Aloha Gell, MD Consulting Physician Obstetrics and Gynecology  10/30/15   Carol Ada, MD Consulting Physician Gastroenterology  01/14/17   Richmond Campbell, MD Consulting Physician Gastroenterology  02/27/18     Chief Complaint  Patient presents with  . Annual Exam    Subjective:  Ann Cain is a 47 y.o.  Female  present for CPE. All past medical history, surgical history, allergies, family history, immunizations, medications and social history were updated in the electronic medical record today. All recent labs, ED visits and hospitalizations within the last year were reviewed.  hypothyrpodism/hyperlipidemia: Patient reports she is taking 3-4 grams of the Nordic natural omega-3 solution. She reports compliance with levothyroxine 100 mcg QD. She is exercising routinely. She has made dietary changes over the last year.  Health maintenance: updated 02/27/18 Colonoscopy: 04/28/2017; h/o adenoma- 3 year follow-up Dr. Earlean Shawl Mammogram: 12/2085; normal, has completed at gynecologist ([facility]). Normal per patient. REQUESTED Cervical cancer screening: 2018, hysterectomy, GYN [facility]. REQUESTED Immunizations: tdap UTD 12/2015, UTD 2018 flu.  Infectious disease screening: HIV with pregnancy 2014 DEXA:Not indicated  Assistive device: none Oxygen XGZ:FPOI Patient has a Dental home. Hospitalizations/ED visits: reviewed   Depression screen [facility] 2/9 02/27/2018 01/14/2017 12/22/2015 12/17/2015  Decreased Interest 0 0 0 0  Down, Depressed, Hopeless 0 0 0 0  PHQ - 2 Score 0 0 0 0   GAD 7 : Generalized Anxiety Score 12/17/2015  Nervous, Anxious, on Edge 2  Control/stop worrying 0  Worry too much - different things 0  Trouble relaxing 1  Restless 0  Easily annoyed or  irritable 1  Afraid - awful might happen 1  Total GAD 7 Score 5     Current Exercise Habits: Home exercise routine, Type of exercise: walking, Time (Minutes): 30, Frequency (Times/Week): 7, Weekly Exercise (Minutes/Week): 210, Intensity: Mild     Immunization History  Administered Date(s) Administered  . Influenza,inj,Quad PF,6+ Mos 06/16/2017  . Tdap 12/22/2015     Past Medical History:  Diagnosis Date  . Acid reflux 2012   egd  . Allergic rhinitis     allergy/asthma patient  . Allergy   . Anal fissure   . Anemia    prior to hysterectomy   . Asthma   . Colon polyp 2015   tubular adenoma  . Dysrhythmia   . Elevated hemoglobin A1c   . Fibroid uterus   . 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 Hemorrhoid   . History of chicken pox   . History of dizziness   . Hypertension   . Hypothyroidism   . Migraines   . Rectal bleeding    No Known Allergies Past Surgical History:  Procedure Laterality Date  . 48-Hour Cardiac Event Monitor  01/2016   Normal study. Sinus rhythm with artifact. No PACs, PVCs or arrhythmias.  . ABDOMINAL HYSTERECTOMY    . CESAREAN SECTION  2002, 2004  . CHOLECYSTECTOMY N/A 02/19/2016   Procedure: LAPAROSCOPIC CHOLECYSTECTOMY;  Surgeon: Coralie Keens, MD;  Location: [facility];  Service: General;  Laterality: N/A;  . COLONOSCOPY  2015   Dr. Claudie Leach internal hemorrhoids, 2 polyps  . DENTAL SURGERY    . TRANSTHORACIC ECHOCARDIOGRAM  01/21/2016   EF 5560%. No RWMA. Normal Valves. No MVP. No PFO/ASD  .  Treadmill Stress Test  01/27/2016   Reached 10.10 METS  Hypertensive response to exercise. No ST deviation. No T-wave inversion. Baseline ST depressions in inferolateral leads remain the same distress.  NEGATIVE GXT   Family History  Problem Relation Age of Onset  . Kidney cancer Father 34  . Lung cancer Maternal Grandfather   . Colon cancer Paternal Grandmother 54  . Heart disease Paternal Grandmother   . Heart disease Paternal Grandfather    Social  History   Socioeconomic History  . Marital status: Married    Spouse name: Not on file  . Number of children: Not on file  . Years of education: Not on file  . Highest education level: Not on file  Occupational History  . Not on file  Social Needs  . Financial resource strain: Not on file  . Food insecurity:    Worry: Not on file    Inability: Not on file  . Transportation needs:    Medical: Not on file    Non-medical: Not on file  Tobacco Use  . Smoking status: Never Smoker  . Smokeless tobacco: Never Used  Substance and Sexual Activity  . Alcohol use: No  . Drug use: No  . Sexual activity: Yes    Birth control/protection: None  Lifestyle  . Physical activity:    Days per week: Not on file    Minutes per session: Not on file  . Stress: Not on file  Relationships  . Social connections:    Talks on phone: Not on file    Gets together: Not on file    Attends religious service: Not on file    Active member of club or organization: Not on file    Attends meetings of clubs or organizations: Not on file    Relationship status: Not on file  . Intimate partner violence:    Fear of current or ex partner: Not on file    Emotionally abused: Not on file    Physically abused: Not on file    Forced sexual activity: Not on file  Other Topics Concern  . Not on file  Social History Narrative   Married to [place]. 3 children (one set of twins). 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 [name], [name].   College education. Masters degree, stay-at-home mother.   No tobacco use, no recreational drugs, occasional alcohol.   Drinks caffeinated beverages, takes a daily vitamin   Wears her seatbelt and bicycle helmet   Smoke detector in the home    Feels safe in her relationship.   Allergies as of 02/27/2018   No Known Allergies     Medication List        Accurate as of 02/27/18 12:35 PM. Always use your most recent med list.          estradiol 0.1 MG/24HR patch Commonly known as:   VIVELLE-DOT APPLY 1 PATCH TO THE SKIN TWICE A WEEK.   levothyroxine 100 MCG tablet Commonly known as:  SYNTHROID, LEVOTHROID Take 1 tablet (100 mcg total) by mouth daily before breakfast.   lidocaine-prilocaine cream Commonly known as:  EMLA APPLY ONCE TOPICALLY AS DIRECTED   PROAIR RESPICLICK 223 (90 Base) MCG/ACT Aepb Generic drug:  Albuterol Sulfate Inhale 2 puffs into the lungs every 6 (six) hours as needed (wheezing).   tretinoin 0.025 % cream Commonly known as:  RETIN-A APPLY 1 APPLICATION A PEARL-SIZED AMOUNT TO FACE IN THE EVENING ONCE A DAY   valACYclovir 500 MG tablet Commonly known as:  VALTREX TAKE 1 TABLET BY MOUTH DAILY AS PREVENTION, INCREASE TO TWICE A DAY FOR 3 DAYS WITH LESIONS       All past medical history, surgical history, allergies, family history, immunizations andmedications were updated in the EMR today and reviewed under the history and medication portions of their EMR.     Recent Results (from the past 2160 hour(s))  Lipid panel     Status: Abnormal   Collection Time: 02/23/18  7:59 AM  Result Value Ref Range   Cholesterol 218 (H) 0 - 200 mg/dL    Comment: ATP III Classification       Desirable:  < 200 mg/dL               Borderline High:  200 - 239 mg/dL          High:  > = 240 mg/dL   Triglycerides 121.0 0.0 - 149.0 mg/dL    Comment: Normal:  <150 mg/dLBorderline High:  150 - 199 mg/dL   HDL 55.00 >39.00 mg/dL   VLDL 24.2 0.0 - 40.0 mg/dL   LDL Cholesterol 139 (H) 0 - 99 mg/dL   Total CHOL/HDL Ratio 4     Comment:                Men          Women1/2 Average Risk     3.4          3.3Average Risk          5.0          4.42X Average Risk          9.6          7.13X Average Risk          15.0          11.0                       NonHDL 163.43     Comment: NOTE:  Non-HDL goal should be 30 mg/dL higher than patient's LDL goal (i.e. LDL goal of < 70 mg/dL, would have non-HDL goal of < 100 mg/dL)  HgB A1c     Status: None   Collection Time: 02/23/18   7:59 AM  Result Value Ref Range   Hgb A1c MFr Bld 5.8 4.6 - 6.5 %    Comment: Glycemic Control Guidelines for People with Diabetes:Non Diabetic:  <6%Goal of Therapy: <7%Additional Action Suggested:  >8%   TSH     Status: None   Collection Time: 02/23/18  7:59 AM  Result Value Ref Range   TSH 2.00 0.35 - 4.50 uIU/mL  Comp Met (CMET)     Status: Abnormal   Collection Time: 02/23/18  7:59 AM  Result Value Ref Range   Sodium 137 135 - 145 mEq/L   Potassium 4.2 3.5 - 5.1 mEq/L   Chloride 105 96 - 112 mEq/L   CO2 27 19 - 32 mEq/L   Glucose, Bld 102 (H) 70 - 99 mg/dL   BUN 23 6 - 23 mg/dL   Creatinine, Ser 0.91 0.40 - 1.20 mg/dL   Total Bilirubin 0.5 0.2 - 1.2 mg/dL   Alkaline Phosphatase 40 39 - 117 U/L   AST 14 0 - 37 U/L   ALT 20 0 - 35 U/L   Total Protein 6.7 6.0 - 8.3 g/dL   Albumin 4.2 3.5 - 5.2 g/dL   Calcium 9.1 8.4 - 10.5 mg/dL   GFR  70.95 >60.00 mL/min  CBC w/Diff     Status: None   Collection Time: 02/23/18  7:59 AM  Result Value Ref Range   WBC 5.0 4.0 - 10.5 K/uL   RBC 4.46 3.87 - 5.11 Mil/uL   Hemoglobin 14.1 12.0 - 15.0 g/dL   HCT 42.2 36.0 - 46.0 %   MCV 94.6 78.0 - 100.0 fl   MCHC 33.4 30.0 - 36.0 g/dL   RDW 12.8 11.5 - 15.5 %   Platelets 211.0 150.0 - 400.0 K/uL   Neutrophils Relative % 63.2 43.0 - 77.0 %   Lymphocytes Relative 22.0 12.0 - 46.0 %   Monocytes Relative 10.8 3.0 - 12.0 %   Eosinophils Relative 3.2 0.0 - 5.0 %   Basophils Relative 0.8 0.0 - 3.0 %   Neutro Abs 3.2 1.4 - 7.7 K/uL   Lymphs Abs 1.1 0.7 - 4.0 K/uL   Monocytes Absolute 0.5 0.1 - 1.0 K/uL   Eosinophils Absolute 0.2 0.0 - 0.7 K/uL   Basophils Absolute 0.0 0.0 - 0.1 K/uL    No results found.   ROS: 14 pt review of systems performed and negative (unless mentioned in an HPI)  Objective: BP 127/89 (BP Location: Right Arm, Patient Position: Sitting, Cuff Size: Large)   Pulse 71   Temp 98.3 F (36.8 C)   Resp 20   Ht 5' 10"  (1.778 m)   Wt 186 lb (84.4 kg)   SpO2 99%   BMI 26.69  kg/m  Gen: Afebrile. No acute distress. Nontoxic in appearance, well-developed, well-nourished,  Overweight, pleasant caucasian female. HENT: AT. [place]. Bilateral TM visualized and normal in appearance, normal external auditory canal. MMM, no oral lesions, adequate dentition. Bilateral nares within normal limits. Throat without erythema, ulcerations or exudates. no Cough on exam, no hoarseness on exam. Eyes:Pupils Equal Round Reactive to light, Extraocular movements intact,  Conjunctiva without redness, discharge or icterus. Neck/lymp/endocrine: Supple,no lymphadenopathy, no thyromegaly CV: RRR no murmur, no edema, +2/4 P posterior tibialis pulses. no carotid bruits. No JVD. Chest: CTAB, no wheeze, rhonchi or crackles. normal Respiratory effort. good Air movement. Abd: Soft. flat. NTND. BS +. no Masses palpated. No hepatosplenomegaly. No rebound tenderness or guarding. Skin: chigger bites lower ext, purpura or petechiae. Warm and well-perfused. Skin intact. Neuro/Msk:  Normal gait. PERLA. EOMi. Alert. Oriented x3.  Cranial nerves II through XII intact. Muscle strength 5/5 upper/lower extremity. DTRs equal bilaterally. Psych: Normal affect, dress and demeanor. Normal speech. Normal thought content and judgment. .  No exam data present  Assessment/plan: Cashay Manganelli is a 47 y.o. female present for CPE. Hyperlipidemia, unspecified hyperlipidemia type/overweight Omega-3 continued Hypothyroidism, unspecified type Stable. Continue synthroid 100 mcg, refills provided today.  Prediabetes:  - diet and exercise.  Migraines: - referred to neuro.  - b12 and magnesium QD recommneded Encounter for preventive health examination Patient was encouraged to exercise greater than 150 minutes a week. Patient was encouraged to choose a diet filled with fresh fruits and vegetables, and lean meats. AVS provided to patient today for education/recommendation on gender specific health and safety  maintenance. Colonoscopy: 04/28/2017; h/o adenoma- 3 year follow-up Dr. Earlean Shawl Mammogram: 12/2085; normal, has completed at gynecologist ([facility]). Normal per patient. REQUESTED Cervical cancer screening: 2018, hysterectomy, GYN [facility]. REQUESTED Immunizations: tdap UTD 12/2015, UTD 2018 flu.  Infectious disease screening: HIV with pregnancy 2014  Return in about 1 year (around 02/28/2019) for CPE.  Electronically signed by: Howard Pouch, DO [place]

## 2018-05-10 DIAGNOSIS — L700 Acne vulgaris: Secondary | ICD-10-CM | POA: Diagnosis not present

## 2018-05-10 DIAGNOSIS — D485 Neoplasm of uncertain behavior of skin: Secondary | ICD-10-CM | POA: Diagnosis not present

## 2018-05-10 DIAGNOSIS — D2372 Other benign neoplasm of skin of left lower limb, including hip: Secondary | ICD-10-CM | POA: Diagnosis not present

## 2018-05-10 DIAGNOSIS — D2361 Other benign neoplasm of skin of right upper limb, including shoulder: Secondary | ICD-10-CM | POA: Diagnosis not present

## 2018-05-10 DIAGNOSIS — L723 Sebaceous cyst: Secondary | ICD-10-CM | POA: Diagnosis not present

## 2018-06-02 ENCOUNTER — Encounter: Payer: Self-pay | Admitting: Family Medicine

## 2018-06-02 DIAGNOSIS — Z1231 Encounter for screening mammogram for malignant neoplasm of breast: Secondary | ICD-10-CM | POA: Diagnosis not present

## 2018-06-02 LAB — HM MAMMOGRAPHY

## 2018-06-13 DIAGNOSIS — H1132 Conjunctival hemorrhage, left eye: Secondary | ICD-10-CM | POA: Diagnosis not present

## 2018-07-14 ENCOUNTER — Ambulatory Visit: Payer: BLUE CROSS/BLUE SHIELD | Admitting: Family Medicine

## 2018-07-14 ENCOUNTER — Encounter: Payer: Self-pay | Admitting: Family Medicine

## 2018-07-14 VITALS — BP 130/89 | HR 71 | Temp 98.2°F | Resp 16 | Ht 70.0 in | Wt 192.0 lb

## 2018-07-14 DIAGNOSIS — M545 Low back pain, unspecified: Secondary | ICD-10-CM

## 2018-07-14 NOTE — Progress Notes (Signed)
OFFICE VISIT  07/14/2018   CC:  Chief Complaint  Patient presents with  . Back Pain   HPI:    Patient is a 47 y.o. Caucasian female who presents for back pain. Waxing and waning pain in back at junction of T/L spine, radiates into bilat L spine region. She does lift and bend low back at work but specific strain incurred. Has no radiating pain or paresthesias in legs.  Has some R hip pain laterally.  Seems to be spasm- like pain lots of times, has woken her up from sleep.  Took 1 tylenol yest morning for a headache. She has done a couple of back stretches. No dysuria or urinary urgency.  No groin or abd pain. No weakness.  No loss of bowel/bladder control.  No saddle anesthesia.   Past Medical History:  Diagnosis Date  . Acid reflux 2012   egd  . Allergic rhinitis    [place] allergy/asthma patient  . Allergy   . Anal fissure   . Anemia    prior to hysterectomy   . Asthma   . Colon polyp 2015   tubular adenoma  . Dysrhythmia   . Elevated hemoglobin A1c   . Fibroid uterus   . 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 Hemorrhoid   . History of chicken pox   . History of dizziness   . Hypertension   . Hypothyroidism   . Migraines   . Rectal bleeding     Past Surgical History:  Procedure Laterality Date  . 48-Hour Cardiac Event Monitor  01/2016   Normal study. Sinus rhythm with artifact. No PACs, PVCs or arrhythmias.  . ABDOMINAL HYSTERECTOMY    . CESAREAN SECTION  2002, 2004  . CHOLECYSTECTOMY N/A 02/19/2016   Procedure: LAPAROSCOPIC CHOLECYSTECTOMY;  Surgeon: Coralie Keens, MD;  Location: [facility];  Service: General;  Laterality: N/A;  . COLONOSCOPY  2015   Dr. Claudie Leach internal hemorrhoids, 2 polyps  . DENTAL SURGERY    . TRANSTHORACIC ECHOCARDIOGRAM  01/21/2016   EF 5560%. No RWMA. Normal Valves. No MVP. No PFO/ASD  . Treadmill Stress Test  01/27/2016   Reached 10.10 METS  Hypertensive response to exercise. No ST deviation. No T-wave inversion. Baseline ST depressions in  inferolateral leads remain the same distress.  NEGATIVE GXT    Outpatient Medications Prior to Visit  Medication Sig Dispense Refill  . estradiol (VIVELLE-DOT) 0.1 MG/24HR patch APPLY 1 PATCH TO THE SKIN TWICE A WEEK.  3  . levothyroxine (SYNTHROID, LEVOTHROID) 100 MCG tablet Take 1 tablet (100 mcg total) by mouth daily before breakfast. 90 tablet 3  . lidocaine-prilocaine (EMLA) cream APPLY ONCE TOPICALLY AS DIRECTED  1  . spironolactone (ALDACTONE) 50 MG tablet Take 50 mg by mouth daily.    Marland Kitchen tretinoin (RETIN-A) 0.025 % cream APPLY 1 APPLICATION A PEARL-SIZED AMOUNT TO FACE IN THE EVENING ONCE A DAY  3  . valACYclovir (VALTREX) 500 MG tablet TAKE 1 TABLET BY MOUTH DAILY AS PREVENTION, INCREASE TO TWICE A DAY FOR 3 DAYS WITH LESIONS  4  . Albuterol Sulfate (PROAIR RESPICLICK) 263 (90 Base) MCG/ACT AEPB Inhale 2 puffs into the lungs every 6 (six) hours as needed (wheezing).      No facility-administered medications prior to visit.     No Known Allergies  ROS As per HPI  PE: Blood pressure 130/89, pulse 71, temperature 98.2 F (36.8 C), temperature source Oral, resp. rate 16, height 5\' 10"  (1.778 m), weight 192 lb (87.1 kg),  SpO2 100 %.  Exam chaperoned by Starla Link, CMA.  Gen: Alert, well appearing.  Patient is oriented to person, place, time, and situation. AFFECT: pleasant, lucid thought and speech. Cervical, thoracic and lumbar spine exam is normal without tenderness, masses or kyphoscoliosis. Full range of motion without pain is noted except mild pain with extension when rising from fully flexed position. Hips: ROM fully intact w/out pain. No tenderness in lateral hips.  She has a mild tenderness located near R ASIS region. LE strength 5/5 prox and dist bilat.   LABS:    Chemistry      Component Value Date/Time   NA 137 02/23/2018 0759   K 4.2 02/23/2018 0759   CL 105 02/23/2018 0759   CO2 27 02/23/2018 0759   BUN 23 02/23/2018 0759   CREATININE 0.91 02/23/2018 0759    CREATININE 0.84 01/12/2017 0802      Component Value Date/Time   CALCIUM 9.1 02/23/2018 0759   ALKPHOS 40 02/23/2018 0759   AST 14 02/23/2018 0759   ALT 20 02/23/2018 0759   BILITOT 0.5 02/23/2018 0759       IMPRESSION AND PLAN:  Musculoskeletal low back pain, with some mild (chronic) R ASIS pain. Reassured. Discussed RICE but pt will substitute tylenol b/c of fear of NSAID use. LB exercises reviewed.  An After Visit Summary was printed and given to the patient.  FOLLOW UP: Return if symptoms worsen or fail to improve.  Signed:  Crissie Sickles, MD           07/14/2018

## 2018-08-10 DIAGNOSIS — Z79899 Other long term (current) drug therapy: Secondary | ICD-10-CM | POA: Diagnosis not present

## 2018-08-10 DIAGNOSIS — L570 Actinic keratosis: Secondary | ICD-10-CM | POA: Diagnosis not present

## 2018-08-10 DIAGNOSIS — L700 Acne vulgaris: Secondary | ICD-10-CM | POA: Diagnosis not present

## 2018-11-13 ENCOUNTER — Other Ambulatory Visit: Payer: Self-pay

## 2018-11-13 MED ORDER — LEVOTHYROXINE SODIUM 100 MCG PO TABS: 100.0000 ug | ORAL_TABLET | Freq: Every day | ORAL | 0 refills | Status: DC

## 2018-11-15 ENCOUNTER — Telehealth: Payer: Self-pay

## 2018-11-15 NOTE — Telephone Encounter (Signed)
Faxed refill request for pts medication, Levothyroxine. Called pt and LM letting patient know to call the office to schedule a F/U visit for medication refills. Faxed to [facility] denying rx. Short supply sent to pharmacy. Pt made aware short supply was sent to local pharmacy and to make appt for further refills

## 2018-11-24 ENCOUNTER — Other Ambulatory Visit: Payer: Self-pay

## 2018-11-24 ENCOUNTER — Encounter: Payer: Self-pay | Admitting: Family Medicine

## 2018-11-24 ENCOUNTER — Ambulatory Visit (INDEPENDENT_AMBULATORY_CARE_PROVIDER_SITE_OTHER): Payer: Commercial Managed Care - PPO | Admitting: Family Medicine

## 2018-11-24 VITALS — Temp 98.2°F | Ht 70.0 in | Wt 190.0 lb

## 2018-11-24 DIAGNOSIS — E663 Overweight: Secondary | ICD-10-CM | POA: Diagnosis not present

## 2018-11-24 DIAGNOSIS — E034 Atrophy of thyroid (acquired): Secondary | ICD-10-CM | POA: Diagnosis not present

## 2018-11-24 DIAGNOSIS — R7309 Other abnormal glucose: Secondary | ICD-10-CM

## 2018-11-24 DIAGNOSIS — E785 Hyperlipidemia, unspecified: Secondary | ICD-10-CM

## 2018-11-24 MED ORDER — LEVOTHYROXINE SODIUM 100 MCG PO TABS: 100.0000 ug | ORAL_TABLET | Freq: Every day | ORAL | 1 refills | Status: DC

## 2018-11-24 NOTE — Patient Instructions (Signed)
I have refilled your meds for you.  Schedule CPE at beginning of August and we will complete all your labs at that time (fast 8 hours).   Stay safe!

## 2018-11-24 NOTE — Progress Notes (Signed)
   VIRTUAL VISIT VIA VIDEO  I connected with Ann Cain on 11/24/18 at  9:20 AM EDT by a video enabled telemedicine application and verified that I am speaking with the correct person using two identifiers. Location patient: Home Location provider: [facility], Office Persons participating in the virtual visit: Patient, Dr. Raoul Pitch and R.Baker, LPN  I discussed the limitations of evaluation and management by telemedicine and the availability of in person appointments. The patient expressed understanding and agreed to proceed.   SUBJECTIVE Chief Complaint  Patient presents with  . Hypothyroidism    Needs refills on medication. No concerns     HPI: Hypothyrpodism/hyperlipidemia/prediabetes/BMI >30: Patient reports she is taking  3-4 grams of the Nordic natural omega-3 solution. She reports compliance with levothyroxine 100 mcg QD. She is exercising routinely. She has made dietary changes over the last year. TSH 2.00 01/2018. ROS: See pertinent positives and negatives per HPI.  Patient Active Problem List   Diagnosis Date Noted  . Overweight (BMI 25.0-29.9) 02/27/2018  . Migraine 02/27/2018  . Hyperlipidemia LDL goal <100 06/14/2016  . Murmur 01/21/2016  . Encounter for preventive health examination 12/22/2015  . H/O adenomatous polyp of colon 11/04/2015  . Genital HSV 10/30/2015  . Hypothyroidism 10/30/2015    Social History   Tobacco Use  . Smoking status: Never Smoker  . Smokeless tobacco: Never Used  Substance Use Topics  . Alcohol use: No    Current Outpatient Medications:  .  Albuterol Sulfate (PROAIR RESPICLICK) 875 (90 Base) MCG/ACT AEPB, Inhale 2 puffs into the lungs every 6 (six) hours as needed (wheezing). , Disp: , Rfl:  .  estradiol (VIVELLE-DOT) 0.1 MG/24HR patch, APPLY 1 PATCH TO THE SKIN TWICE A WEEK., Disp: , Rfl: 3 .  levothyroxine (SYNTHROID, LEVOTHROID) 100 MCG tablet, Take 1 tablet (100 mcg total) by mouth daily before breakfast., Disp: 30  tablet, Rfl: 0 .  lidocaine-prilocaine (EMLA) cream, APPLY ONCE TOPICALLY AS DIRECTED, Disp: , Rfl: 1 .  spironolactone (ALDACTONE) 50 MG tablet, Take 50 mg by mouth daily., Disp: , Rfl:  .  tretinoin (RETIN-A) 0.025 % cream, APPLY 1 APPLICATION A PEARL-SIZED AMOUNT TO FACE IN THE EVENING ONCE A DAY, Disp: , Rfl: 3 .  valACYclovir (VALTREX) 500 MG tablet, TAKE 1 TABLET BY MOUTH DAILY AS PREVENTION, INCREASE TO TWICE A DAY FOR 3 DAYS WITH LESIONS, Disp: , Rfl: 4  No Known Allergies  OBJECTIVE: Temp 98.2 F (36.8 C) (Tympanic)   Ht 5\' 10"  (1.778 m)   Wt 190 lb (86.2 kg)   BMI 27.26 kg/m  Gen: No acute distress. Nontoxic in appearance.  HENT: AT. [place].  MMM.  Eyes:  Conjunctiva without redness, discharge or icterus. CV: no edema Chest: Cough or shortness not present.  Neuro:  Normal gait. Alert. Oriented x3  Psych: Normal affect, dress and demeanor. Normal speech. Normal thought content and judgment.  ASSESSMENT AND PLAN: Ann Cain is a 48 y.o. female present for  Hyperlipidemia, unspecified hyperlipidemia type/overweight Continue Omega-3  Hypothyroidism, unspecified type Continue  synthroid 100 mcg, refills provided today.  Labs to be collected at her CPE in August.  Prediabetes:  - continue diet and exercise. Labs at CPE  > 15 minutes spent with patient, >50% of time spent face to face  Howard Pouch, DO 11/24/2018

## 2018-12-15 DIAGNOSIS — Z6828 Body mass index (BMI) 28.0-28.9, adult: Secondary | ICD-10-CM | POA: Diagnosis not present

## 2018-12-15 DIAGNOSIS — Z130 Encounter for screening for diseases of the blood and blood-forming organs and certain disorders involving the immune mechanism: Secondary | ICD-10-CM | POA: Diagnosis not present

## 2018-12-15 DIAGNOSIS — Z1329 Encounter for screening for other suspected endocrine disorder: Secondary | ICD-10-CM | POA: Diagnosis not present

## 2018-12-15 DIAGNOSIS — Z01419 Encounter for gynecological examination (general) (routine) without abnormal findings: Secondary | ICD-10-CM | POA: Diagnosis not present

## 2018-12-15 LAB — CBC AND DIFFERENTIAL
Hemoglobin: 13.3 (ref 12.0–16.0)
Platelets: 224 (ref 150–399)
WBC: 5.3

## 2018-12-15 LAB — HEPATIC FUNCTION PANEL
ALT: 21 (ref 7–35)
AST: 21 (ref 13–35)
Bilirubin, Total: 0.3

## 2018-12-15 LAB — TSH: TSH: 2.91 (ref 0.41–5.90)

## 2018-12-18 LAB — LIPID PANEL
Cholesterol: 237 — AB (ref 0–200)
HDL: 54 (ref 35–70)

## 2018-12-18 LAB — BASIC METABOLIC PANEL
BUN: 19 (ref 4–21)
Creatinine: 0.8 (ref 0.5–1.1)
Sodium: 142 (ref 137–147)

## 2018-12-18 LAB — HEMOGLOBIN A1C: Hemoglobin A1C: 5.6

## 2019-03-06 ENCOUNTER — Telehealth: Payer: Self-pay | Admitting: Family Medicine

## 2019-03-06 ENCOUNTER — Encounter: Payer: Self-pay | Admitting: Family Medicine

## 2019-03-06 ENCOUNTER — Ambulatory Visit (INDEPENDENT_AMBULATORY_CARE_PROVIDER_SITE_OTHER): Payer: Commercial Managed Care - PPO | Admitting: Family Medicine

## 2019-03-06 ENCOUNTER — Other Ambulatory Visit: Payer: Self-pay

## 2019-03-06 VITALS — BP 116/82 | HR 68 | Temp 97.2°F | Resp 18 | Ht 70.0 in | Wt 193.5 lb

## 2019-03-06 DIAGNOSIS — E785 Hyperlipidemia, unspecified: Secondary | ICD-10-CM | POA: Diagnosis not present

## 2019-03-06 DIAGNOSIS — E663 Overweight: Secondary | ICD-10-CM

## 2019-03-06 DIAGNOSIS — E034 Atrophy of thyroid (acquired): Secondary | ICD-10-CM

## 2019-03-06 DIAGNOSIS — Z130 Encounter for screening for diseases of the blood and blood-forming organs and certain disorders involving the immune mechanism: Secondary | ICD-10-CM

## 2019-03-06 DIAGNOSIS — R7309 Other abnormal glucose: Secondary | ICD-10-CM | POA: Diagnosis not present

## 2019-03-06 DIAGNOSIS — Z0000 Encounter for general adult medical examination without abnormal findings: Secondary | ICD-10-CM

## 2019-03-06 LAB — CBC
HCT: 41.7 % (ref 36.0–46.0)
Hemoglobin: 13.8 g/dL (ref 12.0–15.0)
MCHC: 33.1 g/dL (ref 30.0–36.0)
MCV: 95.6 fl (ref 78.0–100.0)
Platelets: 225 10*3/uL (ref 150.0–400.0)
RBC: 4.36 Mil/uL (ref 3.87–5.11)
RDW: 12.4 % (ref 11.5–15.5)
WBC: 5.5 10*3/uL (ref 4.0–10.5)

## 2019-03-06 LAB — COMPREHENSIVE METABOLIC PANEL
ALT: 18 U/L (ref 0–35)
AST: 20 U/L (ref 0–37)
Albumin: 4.4 g/dL (ref 3.5–5.2)
Alkaline Phosphatase: 46 U/L (ref 39–117)
BUN: 21 mg/dL (ref 6–23)
CO2: 28 mEq/L (ref 19–32)
Calcium: 9.3 mg/dL (ref 8.4–10.5)
Chloride: 104 mEq/L (ref 96–112)
Creatinine, Ser: 0.84 mg/dL (ref 0.40–1.20)
GFR: 72.89 mL/min (ref >60.00)
Glucose, Bld: 96 mg/dL (ref 70–99)
Potassium: 4.4 mEq/L (ref 3.5–5.1)
Sodium: 138 mEq/L (ref 135–145)
Total Bilirubin: 0.5 mg/dL (ref 0.2–1.2)
Total Protein: 6.7 g/dL (ref 6.0–8.3)

## 2019-03-06 LAB — LIPID PANEL
Cholesterol: 235 mg/dL — ABNORMAL HIGH (ref 0–200)
HDL: 52.4 mg/dL (ref >39.00)
LDL Cholesterol: 160 mg/dL — ABNORMAL HIGH (ref 0–99)
NonHDL: 182.73
Total CHOL/HDL Ratio: 4
Triglycerides: 113 mg/dL (ref 0.0–149.0)
VLDL: 22.6 mg/dL (ref 0.0–40.0)

## 2019-03-06 LAB — HEMOGLOBIN A1C: Hgb A1c MFr Bld: 5.8 % (ref 4.6–6.5)

## 2019-03-06 LAB — TSH: TSH: 3.32 u[IU]/mL (ref 0.35–4.50)

## 2019-03-06 MED ORDER — LEVOTHYROXINE SODIUM 100 MCG PO TABS: 100.0000 ug | ORAL_TABLET | Freq: Every day | ORAL | 3 refills | Status: DC

## 2019-03-06 NOTE — Patient Instructions (Signed)
Health Maintenance, Female Adopting a healthy lifestyle and getting preventive care are important in promoting health and wellness. Ask your health care provider about:  The right schedule for you to have regular tests and exams.  Things you can do on your own to prevent diseases and keep yourself healthy. What should I know about diet, weight, and exercise? Eat a healthy diet   Eat a diet that includes plenty of vegetables, fruits, low-fat dairy products, and lean protein.  Do not eat a lot of foods that are high in solid fats, added sugars, or sodium. Maintain a healthy weight Body mass index (BMI) is used to identify weight problems. It estimates body fat based on height and weight. Your health care provider can help determine your BMI and help you achieve or maintain a healthy weight. Get regular exercise Get regular exercise. This is one of the most important things you can do for your health. Most adults should:  Exercise for at least 150 minutes each week. The exercise should increase your heart rate and make you sweat (moderate-intensity exercise).  Do strengthening exercises at least twice a week. This is in addition to the moderate-intensity exercise.  Spend less time sitting. Even light physical activity can be beneficial. Watch cholesterol and blood lipids Have your blood tested for lipids and cholesterol at 46 years of age, then have this test every 5 years. Have your cholesterol levels checked more often if:  Your lipid or cholesterol levels are high.  You are older than 46 years of age.  You are at high risk for heart disease. What should I know about cancer screening? Depending on your health history and family history, you may need to have cancer screening at various ages. This may include screening for:  Breast cancer.  Cervical cancer.  Colorectal cancer.  Skin cancer.  Lung cancer. What should I know about heart disease, diabetes, and high blood  pressure? Blood pressure and heart disease  High blood pressure causes heart disease and increases the risk of stroke. This is more likely to develop in people who have high blood pressure readings, are of African descent, or are overweight.  Have your blood pressure checked: ? Every 3-5 years if you are 18-39 years of age. ? Every year if you are 40 years old or older. Diabetes Have regular diabetes screenings. This checks your fasting blood sugar level. Have the screening done:  Once every three years after age 40 if you are at a normal weight and have a low risk for diabetes.  More often and at a younger age if you are overweight or have a high risk for diabetes. What should I know about preventing infection? Hepatitis B If you have a higher risk for hepatitis B, you should be screened for this virus. Talk with your health care provider to find out if you are at risk for hepatitis B infection. Hepatitis C Testing is recommended for:  Everyone born from 1945 through 1965.  Anyone with known risk factors for hepatitis C. Sexually transmitted infections (STIs)  Get screened for STIs, including gonorrhea and chlamydia, if: ? You are sexually active and are younger than 46 years of age. ? You are older than 46 years of age and your health care provider tells you that you are at risk for this type of infection. ? Your sexual activity has changed since you were last screened, and you are at increased risk for chlamydia or gonorrhea. Ask your health care provider if   you are at risk.  Ask your health care provider about whether you are at high risk for HIV. Your health care provider may recommend a prescription medicine to help prevent HIV infection. If you choose to take medicine to prevent HIV, you should first get tested for HIV. You should then be tested every 3 months for as long as you are taking the medicine. Pregnancy  If you are about to stop having your period (premenopausal) and  you may become pregnant, seek counseling before you get pregnant.  Take 400 to 800 micrograms (mcg) of folic acid every day if you become pregnant.  Ask for birth control (contraception) if you want to prevent pregnancy. Osteoporosis and menopause Osteoporosis is a disease in which the bones lose minerals and strength with aging. This can result in bone fractures. If you are 65 years old or older, or if you are at risk for osteoporosis and fractures, ask your health care provider if you should:  Be screened for bone loss.  Take a calcium or vitamin D supplement to lower your risk of fractures.  Be given hormone replacement therapy (HRT) to treat symptoms of menopause. Follow these instructions at home: Lifestyle  Do not use any products that contain nicotine or tobacco, such as cigarettes, e-cigarettes, and chewing tobacco. If you need help quitting, ask your health care provider.  Do not use street drugs.  Do not share needles.  Ask your health care provider for help if you need support or information about quitting drugs. Alcohol use  Do not drink alcohol if: ? Your health care provider tells you not to drink. ? You are pregnant, may be pregnant, or are planning to become pregnant.  If you drink alcohol: ? Limit how much you use to 0-1 drink a day. ? Limit intake if you are breastfeeding.  Be aware of how much alcohol is in your drink. In the [place] one drink equals one 12 oz bottle of beer (355 mL), one 5 oz glass of wine (148 mL), or one 1 oz glass of hard liquor (44 mL). General instructions  Schedule regular health, dental, and eye exams.  Stay current with your vaccines.  Tell your health care provider if: ? You often feel depressed. ? You have ever been abused or do not feel safe at home. Summary  Adopting a healthy lifestyle and getting preventive care are important in promoting health and wellness.  Follow your health care provider's instructions about healthy  diet, exercising, and getting tested or screened for diseases.  Follow your health care provider's instructions on monitoring your cholesterol and blood pressure. This information is not intended to replace advice given to you by your health care provider. Make sure you discuss any questions you have with your health care provider. Document Released: 02/01/2011 Document Revised: 07/12/2018 Document Reviewed: 07/12/2018 Elsevier Patient Education  2020 Elsevier Inc.  

## 2019-03-06 NOTE — Progress Notes (Signed)
Patient ID: Ann Cain, female  DOB: Feb 27, 1971, 48 y.o.   MRN: [id number] Patient Care Team    Relationship Specialty Notifications Start End  Ma Hillock, DO PCP - General Family Medicine  10/30/15   Aloha Gell, MD Consulting Physician Obstetrics and Gynecology  10/30/15   Carol Ada, MD Consulting Physician Gastroenterology  01/14/17   Richmond Campbell, MD Consulting Physician Gastroenterology  02/27/18   Debbora Presto, NP Nurse Practitioner Family Medicine  03/06/19     Chief Complaint  Patient presents with  . Annual Exam    Fasting. Pap smear, march 2020. Mammogram 06/2018. [facility].     Subjective:  Ann Cain is a 48 y.o.  Female  present for CPE. All past medical history, surgical history, allergies, family history, immunizations, medications and social history were updated in the electronic medical record today. All recent labs, ED visits and hospitalizations within the last year were reviewed.  Hypothyrpodism/hyperlipidemia/prediabetes/BMI >30: Patient reports she is taking  3-4 grams of the Nordic natural omega-3 solution.She reports compliance with levothyroxine 100 mcg QD.She is exercising routinely. She has made dietary changes over the last year. TSH 2.00 01/2018. Pt reports she had some lab at her GYN, but she is not sure what they tested  Health maintenance:updated 02/27/18 Colonoscopy: 04/28/2017; h/o adenoma- 3 year follow-up Dr. Earlean Shawl  Mammogram:06/2018; normal, has completed at gynecologist([facility]). Normal per patient. REQUESTED Cervical cancer screening:12/15/2018, hysterectomy, GYN [facility]. REQUESTED Immunizations: tdapUTD 12/2015, influenza encouraged yearly.  Infectious disease screening: HIV with pregnancy 2014 DEXA:Not indicated  Assistive device: none Oxygen IFO:YDXA Patient has a Dental home. Hospitalizations/ED visits: reviewed  Depression screen [facility] 2/9 03/06/2019 02/27/2018 01/14/2017 12/22/2015 12/17/2015   Decreased Interest 0 0 0 0 0  Down, Depressed, Hopeless 0 0 0 0 0  PHQ - 2 Score 0 0 0 0 0   GAD 7 : Generalized Anxiety Score 12/17/2015  Nervous, Anxious, on Edge 2  Control/stop worrying 0  Worry too much - different things 0  Trouble relaxing 1  Restless 0  Easily annoyed or irritable 1  Afraid - awful might happen 1  Total GAD 7 Score 5    Immunization History  Administered Date(s) Administered  . Influenza,inj,Quad PF,6+ Mos 06/16/2017  . Tdap 12/22/2015   Past Medical History:  Diagnosis Date  . Acid reflux 2012   egd  . Allergic rhinitis    [place] allergy/asthma patient  . Allergy   . Anal fissure   . Anemia    prior to hysterectomy   . Asthma   . Colon polyp 2015   tubular adenoma  . Dysrhythmia   . Elevated hemoglobin A1c   . Fibroid uterus   . 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 Hemorrhoid   . History of chicken pox   . History of dizziness   . Hypertension   . Hypothyroidism   . Migraines   . Rectal bleeding    No Known Allergies Past Surgical History:  Procedure Laterality Date  . 48-Hour Cardiac Event Monitor  01/2016   Normal study. Sinus rhythm with artifact. No PACs, PVCs or arrhythmias.  . ABDOMINAL HYSTERECTOMY    . CESAREAN SECTION  2002, 2004  . CHOLECYSTECTOMY N/A 02/19/2016   Procedure: LAPAROSCOPIC CHOLECYSTECTOMY;  Surgeon: Coralie Keens, MD;  Location: [facility];  Service: General;  Laterality: N/A;  . COLONOSCOPY  2015   Dr. Claudie Leach internal hemorrhoids, 2 polyps  . DENTAL SURGERY    . TRANSTHORACIC ECHOCARDIOGRAM  01/21/2016   EF 5560%. No RWMA. Normal Valves. No MVP. No PFO/ASD  . Treadmill Stress Test  01/27/2016   Reached 10.10 METS  Hypertensive response to exercise. No ST deviation. No T-wave inversion. Baseline ST depressions in inferolateral leads remain the same distress.  NEGATIVE GXT   Family History  Problem Relation Age of Onset  . Kidney cancer Father 37  . Lung cancer Maternal Grandfather   . Colon cancer Paternal  Grandmother 83  . Heart disease Paternal Grandmother   . Heart disease Paternal Grandfather    Social History   Social History Narrative   Married to [place]. 3 children (one set of twins). 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 Ann Cain, Ann Cain.   College education. Masters degree, stay-at-home mother.   No tobacco use, no recreational drugs, occasional alcohol.   Drinks caffeinated beverages, takes a daily vitamin   Wears her seatbelt and bicycle helmet   Smoke detector in the home    Feels safe in her relationship.    Allergies as of 03/06/2019   No Known Allergies     Medication List       Accurate as of March 06, 2019  8:39 AM. If you have any questions, ask your nurse or doctor.        STOP taking these medications   lidocaine-prilocaine cream Commonly known as: EMLA Stopped by: Howard Pouch, DO     TAKE these medications   ALLERGY 24-HR PO Take 1 tablet by mouth daily.   b complex vitamins tablet Take 1 tablet by mouth daily.   [facility] TURMERIC CURCUMIN PO Take 1 tablet by mouth daily.   estradiol 0.1 MG/24HR patch Commonly known as: VIVELLE-DOT APPLY 1 PATCH TO THE SKIN TWICE A WEEK.   levothyroxine 100 MCG tablet Commonly known as: SYNTHROID Take 1 tablet (100 mcg total) by mouth daily before breakfast.   NAC 600 MG Caps Generic drug: Acetylcysteine Take 1 capsule by mouth daily.   Omega 3 1200 MG Caps Take 1 capsule by mouth daily. 1560 mg daily   ProAir RespiClick 638 (90 Base) MCG/ACT Aepb Generic drug: Albuterol Sulfate Inhale 2 puffs into the lungs every 6 (six) hours as needed (wheezing).   spironolactone 50 MG tablet Commonly known as: ALDACTONE Take 50 mg by mouth daily.   tretinoin 0.025 % cream Commonly known as: RETIN-A APPLY 1 APPLICATION A PEARL-SIZED AMOUNT TO FACE IN THE EVENING ONCE A DAY   valACYclovir 500 MG tablet Commonly known as: VALTREX TAKE 1 TABLET BY MOUTH DAILY AS PREVENTION, INCREASE TO TWICE A DAY FOR 3 DAYS WITH LESIONS    vitamin C 250 MG tablet Commonly known as: ASCORBIC ACID Take 250 mg by mouth 2 (two) times daily.   Vitamin D (Cholecalciferol) 50 MCG (2000 UT) Caps Take 1 tablet by mouth.       All past medical history, surgical history, allergies, family history, immunizations andmedications were updated in the EMR today and reviewed under the history and medication portions of their EMR.     No results found for this or any previous visit (from the past 2160 hour(s)).  No results found.   ROS: 14 pt review of systems performed and negative (unless mentioned in an HPI)  Objective: BP 116/82 (BP Location: Left Arm, Patient Position: Sitting, Cuff Size: Normal)   Pulse 68   Temp (!) 97.2 F (36.2 C) (Temporal)   Resp 18   Ht 5\' 10"  (1.778 m)   Wt 193 lb 8 oz (87.8 kg)  SpO2 99%   BMI 27.76 kg/m  Gen: Afebrile. No acute distress. Nontoxic in appearance, well-developed, well-nourished,  Pleasant caucasian female.  HENT: AT. [place]. Bilateral TM visualized and normal in appearance, normal external auditory canal. MMM, no oral lesions, adequate dentition. Bilateral nares within normal limits. Throat without erythema, ulcerations or exudates. no Cough on exam, no hoarseness on exam. Eyes:Pupils Equal Round Reactive to light, Extraocular movements intact,  Conjunctiva without redness, discharge or icterus. Neck/lymp/endocrine: Supple,no lymphadenopathy, no thyromegaly CV: RRR no murmur, no edema, +2/4 P posterior tibialis pulses. no carotid bruits. No JVD. Chest: CTAB, no wheeze, rhonchi or crackles. normal Respiratory effort. good Air movement. Abd: Soft. flat. NTND. BS present. no Masses palpated. No hepatosplenomegaly. No rebound tenderness or guarding. Skin: no rashes, purpura or petechiae. Warm and well-perfused. Skin intact. Neuro/Msk:  Normal gait. PERLA. EOMi. Alert. Oriented x3.  Cranial nerves II through XII intact. Muscle strength 5/5 upper/lower extremity. DTRs equal bilaterally. Psych:  Normal affect, dress and demeanor. Normal speech. Normal thought content and judgment.  No exam data present  Assessment/plan: Aruna Nestler is a 48 y.o. female present for CPE  Hyperlipidemia, unspecified hyperlipidemia type/overweight Continue Omega-3  Lipid panel collected today - cbc, cmp, TSH Hypothyroidism, unspecified type Continue  synthroid 100 mcg, refills provided today.  - TSH collected today Prediabetes:  - continue diet and exercise.  - a1c collected today Elevated hemoglobin A1c - Hemoglobin A1c Screening for deficiency anemia - CBC Encounter for preventive health examination Patient was encouraged to exercise greater than 150 minutes a week. Patient was encouraged to choose a diet filled with fresh fruits and vegetables, and lean meats. AVS provided to patient today for education/recommendation on gender specific health and safety maintenance. Colonoscopy: 04/28/2017; h/o adenoma- 3 year follow-up Dr. Earlean Shawl  Mammogram:06/2018; normal, has completed at gynecologist([facility]). Normal per patient. REQUESTED Cervical cancer screening:12/15/2018, hysterectomy, GYN [facility]. REQUESTED Immunizations: tdapUTD 12/2015, influenza encouraged yearly.  Infectious disease screening: HIV with pregnancy 2014 DEXA:Not indicated     Return in about 1 year (around 03/05/2020) for CPE (30 min).  Electronically signed by: Howard Pouch, DO [place]

## 2019-03-06 NOTE — Addendum Note (Signed)
Addended by: Howard Pouch A on: 03/06/2019 05:34 PM   Modules accepted: Orders

## 2019-03-06 NOTE — Telephone Encounter (Signed)
Please inform patient the following information: -Her liver and kidney function is normal. -Her thyroid function is normal.  Left -Her blood counts are normal. -Her diabetes screening is the same as last year at 5.8, this is the higher end of normal.  Exercising-trying the online yoga may be helpful and getting back on her normal diet. -Her cholesterol panel is about the same as last year with the exception of her bad cholesterol/LDL is higher at 160.  It was 139 last year.  The elevation in her bad cholesterol does put her at increased cardiovascular risk.  I would encourage her to start a very low-dose statin medication to help keep her cardiovascular protection and lower her cholesterol.  If she is agreeable I will call this into her pharmacy and she will need a follow-up appointment in 3 months with provider and fasting labs. I would recommend a mediterranean diet and regular exercise.  A mediterranean diet is high in fruits, vegetables, whole grains, fish, chicken, nuts, healthy fats (olive oil or canola oil). Low fat dairy. There are many online resources and books on this diet. Limit butter, margarine, red meat and sweets.

## 2019-03-07 MED ORDER — PRAVASTATIN SODIUM 20 MG PO TABS: 20.0000 mg | ORAL_TABLET | Freq: Every day | ORAL | 3 refills | Status: DC

## 2019-03-07 NOTE — Telephone Encounter (Signed)
Called pt and gave lab results. She stated an understanding. Pt is agreeable with beginning a low dose statin. [facility]. Pt would like to know if she should continue to take her Omega 3 while on the statin?

## 2019-03-07 NOTE — Telephone Encounter (Signed)
Patient notified of PCP recommendations and is agreement and expresses an understanding.   Ok for PEC to Discuss results / PCP recommendations / Schedule patient.   

## 2019-03-07 NOTE — Telephone Encounter (Signed)
Pravastatin prescribed. Continue to take Omega3/fish oil. Schedule follow up in 3 motnhs

## 2019-03-07 NOTE — Addendum Note (Signed)
Addended by: Howard Pouch A on: 03/07/2019 12:27 PM   Modules accepted: Orders

## 2019-03-15 ENCOUNTER — Telehealth: Payer: Self-pay

## 2019-03-15 NOTE — Telephone Encounter (Signed)
If she had the Tdap in 2017 it is good for 10 years.

## 2019-03-15 NOTE — Telephone Encounter (Signed)
Called patient and advised that her Tdap vaccine was good until 2027. Patient verbalized understanding

## 2019-03-15 NOTE — Telephone Encounter (Signed)
Copied from [place] [phone]. Topic: General - Call Back - No Documentation >> Mar 14, 2019  4:45 PM Erick Blinks wrote: Reason for CRM: Pt is requesting to be messaged via Mychart pertaining to TDAP vaccine. She has a coworker that has pertussis, and wants to make sure her vaccine from 2017 is still sufficient. Please advise  Please advise

## 2019-03-20 ENCOUNTER — Encounter: Payer: Self-pay | Admitting: Family Medicine

## 2019-03-22 ENCOUNTER — Other Ambulatory Visit: Payer: Self-pay | Admitting: Family Medicine

## 2019-03-30 ENCOUNTER — Telehealth: Payer: Self-pay | Admitting: Family Medicine

## 2019-03-30 MED ORDER — LEVOTHYROXINE SODIUM 100 MCG PO TABS: 100.0000 ug | ORAL_TABLET | Freq: Every day | ORAL | 3 refills | Status: DC

## 2019-03-30 MED ORDER — PRAVASTATIN SODIUM 20 MG PO TABS: 20.0000 mg | ORAL_TABLET | Freq: Every day | ORAL | 3 refills | Status: DC

## 2019-03-30 NOTE — Telephone Encounter (Signed)
[facility] for [facility]. She can pick a script up at [facility] if she will run out before it will get there.  Sent her statin there today as well.    [facility] Guiles [facility] and [facility] statin and thyroid medication - if [facility] needs [facility] day course.

## 2019-03-30 NOTE — Telephone Encounter (Signed)
Pt was called and she has enough medications. [facility]

## 2019-03-30 NOTE — Telephone Encounter (Signed)
levothyroxine (SYNTHROID) 100 MCG tablet [id number]    [facility] - she is almost out of meds Call refill to [place] mail order pharmacy [facility] in our system too  She can be reached at [phone]

## 2019-06-07 ENCOUNTER — Encounter: Payer: Self-pay | Admitting: Family Medicine

## 2019-06-07 ENCOUNTER — Ambulatory Visit (INDEPENDENT_AMBULATORY_CARE_PROVIDER_SITE_OTHER): Payer: 59 | Admitting: Family Medicine

## 2019-06-07 ENCOUNTER — Other Ambulatory Visit: Payer: Self-pay

## 2019-06-07 VITALS — BP 128/87 | HR 75 | Temp 98.7°F | Resp 16 | Ht 70.0 in | Wt 194.0 lb

## 2019-06-07 DIAGNOSIS — E034 Atrophy of thyroid (acquired): Secondary | ICD-10-CM | POA: Diagnosis not present

## 2019-06-07 DIAGNOSIS — Z79899 Other long term (current) drug therapy: Secondary | ICD-10-CM | POA: Diagnosis not present

## 2019-06-07 DIAGNOSIS — G43809 Other migraine, not intractable, without status migrainosus: Secondary | ICD-10-CM | POA: Diagnosis not present

## 2019-06-07 DIAGNOSIS — E785 Hyperlipidemia, unspecified: Secondary | ICD-10-CM | POA: Diagnosis not present

## 2019-06-07 DIAGNOSIS — E663 Overweight: Secondary | ICD-10-CM

## 2019-06-07 LAB — LIPID PANEL
Cholesterol: 191 mg/dL (ref 0–200)
HDL: 50.1 mg/dL (ref >39.00)
LDL Cholesterol: 116 mg/dL — ABNORMAL HIGH (ref 0–99)
NonHDL: 140.61
Total CHOL/HDL Ratio: 4
Triglycerides: 121 mg/dL (ref 0.0–149.0)
VLDL: 24.2 mg/dL (ref 0.0–40.0)

## 2019-06-07 LAB — COMPREHENSIVE METABOLIC PANEL
ALT: 19 U/L (ref 0–35)
AST: 17 U/L (ref 0–37)
Albumin: 4.7 g/dL (ref 3.5–5.2)
Alkaline Phosphatase: 50 U/L (ref 39–117)
BUN: 16 mg/dL (ref 6–23)
CO2: 27 mEq/L (ref 19–32)
Calcium: 9.2 mg/dL (ref 8.4–10.5)
Chloride: 104 mEq/L (ref 96–112)
Creatinine, Ser: 0.86 mg/dL (ref 0.40–1.20)
GFR: 70.85 mL/min (ref >60.00)
Glucose, Bld: 99 mg/dL (ref 70–99)
Potassium: 4.2 mEq/L (ref 3.5–5.1)
Sodium: 138 mEq/L (ref 135–145)
Total Bilirubin: 0.5 mg/dL (ref 0.2–1.2)
Total Protein: 7.2 g/dL (ref 6.0–8.3)

## 2019-06-07 MED ORDER — SUMATRIPTAN SUCCINATE 50 MG PO TABS: ORAL_TABLET | ORAL | 5 refills | Status: DC

## 2019-06-07 MED ORDER — PRAVASTATIN SODIUM 20 MG PO TABS: 20.0000 mg | ORAL_TABLET | Freq: Every day | ORAL | 3 refills | Status: DC

## 2019-06-07 MED ORDER — KETOROLAC TROMETHAMINE 60 MG/2ML IM SOLN
60.0000 mg | Freq: Once | INTRAMUSCULAR | Status: AC
  Administered 2019-06-07: 09:00:00 60 mg via INTRAMUSCULAR

## 2019-06-07 MED ORDER — LEVOTHYROXINE SODIUM 100 MCG PO TABS: 100.0000 ug | ORAL_TABLET | Freq: Every day | ORAL | 3 refills | Status: DC

## 2019-06-07 MED ORDER — HYDROXYZINE PAMOATE 25 MG PO CAPS: 25.0000 mg | ORAL_CAPSULE | Freq: Three times a day (TID) | ORAL | 5 refills | Status: DC | PRN

## 2019-06-07 NOTE — Progress Notes (Signed)
Patient ID: Ann Cain, female  DOB: 30-Oct-1970, 48 y.o.   MRN: [id number] Patient Care Team    Relationship Specialty Notifications Start End  Ma Hillock, DO PCP - General Family Medicine  10/30/15   Aloha Gell, MD Consulting Physician Obstetrics and Gynecology  10/30/15   Carol Ada, MD Consulting Physician Gastroenterology  01/14/17   Richmond Campbell, MD Consulting Physician Gastroenterology  02/27/18   Debbora Presto, NP Nurse Practitioner Family Medicine  03/06/19     Chief Complaint  Patient presents with  . Hypothyroidism    patient got a new insurance and [facility]  . Hyperlipidemia    Subjective:  Ann Cain is a 48 y.o.  Female  present for follow up after statin start  Hypothyrpodism/hyperlipidemia/prediabetes/BMI >30: Patient reports she is taking  3-4 grams of the Nordic natural omega-3 solution.She reports compliance with levothyroxine 100 mcg QD.She is exercising routinely. She has made dietary changes over the last year. TSH WNL 03/2019. Her cholesterol was rather elevated at her cpe 3 months ago. She has started the statin medication and has tolerated without side effects.   Migraine: She reports she woke up with a migraine today. Her migraines are usually right sided from posterior neck to right eye. She was tried on maxalt in the past and did not feel it worked great for her. She typically has about 2 -3 migraines a year. She will take OTC products to manage and lay down. She does endorse and increase in her migraines this past month has been 2-3. She believes it is from the extreme weather changes/pressure.   Lipid Panel     Component Value Date/Time   CHOL 235 (H) 03/06/2019 0838   TRIG 113.0 03/06/2019 0838   HDL 52.40 03/06/2019 0838   CHOLHDL 4 03/06/2019 0838   VLDL 22.6 03/06/2019 0838   LDLCALC 160 (H) 03/06/2019 0838    Depression screen PHQ 2/9 03/06/2019 02/27/2018 01/14/2017 12/22/2015 12/17/2015   Decreased Interest 0 0 0 0 0  Down, Depressed, Hopeless 0 0 0 0 0  PHQ - 2 Score 0 0 0 0 0   GAD 7 : Generalized Anxiety Score 12/17/2015  Nervous, Anxious, on Edge 2  Control/stop worrying 0  Worry too much - different things 0  Trouble relaxing 1  Restless 0  Easily annoyed or irritable 1  Afraid - awful might happen 1  Total GAD 7 Score 5    Immunization History  Administered Date(s) Administered  . Influenza,inj,Quad PF,6+ Mos 06/16/2017  . Tdap 12/22/2015   Past Medical History:  Diagnosis Date  . Acid reflux 2012   egd  . Allergic rhinitis    [place] allergy/asthma patient  . Allergy   . Anal fissure   . Anemia    prior to hysterectomy   . Asthma   . Colon polyp 2015   tubular adenoma  . Dysrhythmia   . Elevated hemoglobin A1c   . Fibroid uterus   . 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 Hemorrhoid   . History of chicken pox   . History of dizziness   . Hypertension   . Hypothyroidism   . Migraines   . Rectal bleeding    No Known Allergies Past Surgical History:  Procedure Laterality Date  . 48-Hour Cardiac Event Monitor  01/2016   Normal study. Sinus rhythm with artifact. No PACs, PVCs or arrhythmias.  . ABDOMINAL HYSTERECTOMY    .  CESAREAN SECTION  2002, 2004  . CHOLECYSTECTOMY N/A 02/19/2016   Procedure: LAPAROSCOPIC CHOLECYSTECTOMY;  Surgeon: Coralie Keens, MD;  Location: [facility];  Service: General;  Laterality: N/A;  . COLONOSCOPY  2015   Dr. Claudie Leach internal hemorrhoids, 2 polyps  . DENTAL SURGERY    . TRANSTHORACIC ECHOCARDIOGRAM  01/21/2016   EF 5560%. No RWMA. Normal Valves. No MVP. No PFO/ASD  . Treadmill Stress Test  01/27/2016   Reached 10.10 METS  Hypertensive response to exercise. No ST deviation. No T-wave inversion. Baseline ST depressions in inferolateral leads remain the same distress.  NEGATIVE GXT   Family History  Problem Relation Age of Onset  . Kidney cancer Father 59  . Lung cancer Maternal Grandfather   . Colon cancer Paternal  Grandmother 50  . Heart disease Paternal Grandmother   . Heart disease Paternal Grandfather    Social History   Social History Narrative   Married to [place]. 3 children (one set of twins). 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 [name], [name].   College education. Masters degree, stay-at-home mother.   No tobacco use, no recreational drugs, occasional alcohol.   Drinks caffeinated beverages, takes a daily vitamin   Wears her seatbelt and bicycle helmet   Smoke detector in the home    Feels safe in her relationship.    Allergies as of 06/07/2019   No Known Allergies     Medication List       Accurate as of June 07, 2019 11:07 AM. If you have any questions, ask your nurse or doctor.        ALLERGY 24-HR PO Take 1 tablet by mouth daily.   b complex vitamins tablet Take 1 tablet by mouth daily.   CVS TURMERIC CURCUMIN PO Take 1 tablet by mouth daily.   estradiol 0.1 MG/24HR patch Commonly known as: VIVELLE-DOT APPLY 1 PATCH TO THE SKIN TWICE A WEEK.   hydrOXYzine 25 MG capsule Commonly known as: Vistaril Take 1 capsule (25 mg total) by mouth 3 (three) times daily as needed. Started by: Howard Pouch, DO   levothyroxine 100 MCG tablet Commonly known as: SYNTHROID Take 1 tablet (100 mcg total) by mouth daily before breakfast.   NAC 600 MG Caps Generic drug: Acetylcysteine Take 1 capsule by mouth daily.   Omega 3 1200 MG Caps Take 1 capsule by mouth daily. 1560 mg daily   pravastatin 20 MG tablet Commonly known as: PRAVACHOL Take 1 tablet (20 mg total) by mouth daily.   ProAir RespiClick 122 (90 Base) MCG/ACT Aepb Generic drug: Albuterol Sulfate Inhale 2 puffs into the lungs every 6 (six) hours as needed (wheezing).   spironolactone 50 MG tablet Commonly known as: ALDACTONE Take 50 mg by mouth daily.   SUMAtriptan 50 MG tablet Commonly known as: Imitrex May repeat in 2 hours once if headache persists or recurs. Started by: Howard Pouch, DO   tretinoin 0.025  % cream Commonly known as: RETIN-A APPLY 1 APPLICATION A PEARL-SIZED AMOUNT TO FACE IN THE EVENING ONCE A DAY   valACYclovir 500 MG tablet Commonly known as: VALTREX TAKE 1 TABLET BY MOUTH DAILY AS PREVENTION, INCREASE TO TWICE A DAY FOR 3 DAYS WITH LESIONS   vitamin C 250 MG tablet Commonly known as: ASCORBIC ACID Take 250 mg by mouth 2 (two) times daily.   Vitamin D (Cholecalciferol) 50 MCG (2000 UT) Caps Take 1 tablet by mouth.       All past medical history, surgical history, allergies, family history, immunizations andmedications were  updated in the EMR today and reviewed under the history and medication portions of their EMR.     No results found for this or any previous visit (from the past 2160 hour(s)).  No results found.  ROS: 14 pt review of systems performed and negative (unless mentioned in an HPI)  Objective: BP 128/87 (BP Location: Left Arm, Patient Position: Sitting, Cuff Size: Normal)   Pulse 75   Temp 98.7 F (37.1 C) (Temporal)   Resp 16   Ht _0  (1.778 m)   Wt 194 lb (88 kg)   SpO2 100%   BMI 27.84 kg/m  Gen: Afebrile. No acute distress. Nontoxic in appearance, well-developed, well-nourished,  Pleasant caucasian female.  HENT: AT. [place]. Bilateral TM visualized and normal in appearance, normal external auditory canal. MMM, no oral lesions, adequate dentition. Bilateral nares within normal limits. Throat without erythema, ulcerations or exudates. no Cough on exam, no hoarseness on exam. Eyes:Pupils Equal Round Reactive to light, Extraocular movements intact,  Conjunctiva without redness, discharge or icterus. Neck/lymp/endocrine: Supple,no lymphadenopathy, no thyromegaly CV: RRR no murmur, no edema, +2/4 P posterior tibialis pulses. no carotid bruits. No JVD. Chest: CTAB, no wheeze, rhonchi or crackles. normal Respiratory effort. good Air movement. Abd: Soft. flat. NTND. BS present. no Masses palpated. No hepatosplenomegaly. No rebound tenderness or  guarding. Skin: no rashes, purpura or petechiae. Warm and well-perfused. Skin intact. Neuro/Msk:  Normal gait. PERLA. EOMi. Alert. Oriented x3.  Cranial nerves II through XII intact. Muscle strength 5/5 upper/lower extremity. DTRs equal bilaterally. Psych: Normal affect, dress and demeanor. Normal speech. Normal thought content and judgment.  No exam data present  Assessment/plan: Ann Cain is a 48 y.o. female present for CPE  Hyperlipidemia, unspecified hyperlipidemia type/overweight Continue Omega-3. Continue statin. Refills sent to her mail in pharmacy per her request. Local scripts will be canceled.  Lipid panel collected today - CMP, lipid collected today Hypothyroidism, unspecified type Continue  synthroid 100 mcg. Stable. >> sent script to mail in pharmacy per her request. Cancel local pharm script.  -follow w/ CPE yearly. (last 03/2019)  Migraine:  New problem to this provider.  Discussed options with her and she is agreeable to toradol injection to day.  Vistaril and imitrex discussed and she would like to try those as well. She was provided with verbal and written/avs instructions on use.  Ok to refill on CPE yearly if helpful.  Sent script to local pharm per her request  Orders Placed This Encounter  Procedures  . Lipid panel  . Comp Met (CMET)   > 25 minutes spent with patient, >50% of time spent face to face    Electronically signed by: Howard Pouch, [place]

## 2019-06-07 NOTE — Patient Instructions (Signed)
Toradol injection today and go home, take a hydroxyzine and lay down.  I called in hydroxyzine and Imitrex. Imitrex works if taken at first sign of headache- even if you wake up with it.  You can take imitrex and hydroxyzine at same time.    Try changing your daily allergy medicine to xyzal, over the counter , before bed also.    I will call you with lab results once received.

## 2019-07-24 LAB — HM MAMMOGRAPHY

## 2019-07-30 ENCOUNTER — Encounter: Payer: Self-pay | Admitting: Family Medicine

## 2020-05-09 ENCOUNTER — Other Ambulatory Visit: Payer: Self-pay | Admitting: Family Medicine

## 2020-06-16 ENCOUNTER — Other Ambulatory Visit: Payer: Self-pay

## 2020-06-17 ENCOUNTER — Ambulatory Visit (INDEPENDENT_AMBULATORY_CARE_PROVIDER_SITE_OTHER): Payer: 59 | Admitting: Family Medicine

## 2020-06-17 ENCOUNTER — Encounter: Payer: Self-pay | Admitting: Family Medicine

## 2020-06-17 VITALS — BP 112/79 | HR 64 | Temp 98.2°F | Ht 69.75 in | Wt 193.0 lb

## 2020-06-17 DIAGNOSIS — E785 Hyperlipidemia, unspecified: Secondary | ICD-10-CM | POA: Diagnosis not present

## 2020-06-17 DIAGNOSIS — Z0000 Encounter for general adult medical examination without abnormal findings: Secondary | ICD-10-CM

## 2020-06-17 DIAGNOSIS — Z1159 Encounter for screening for other viral diseases: Secondary | ICD-10-CM

## 2020-06-17 DIAGNOSIS — Z79899 Other long term (current) drug therapy: Secondary | ICD-10-CM | POA: Diagnosis not present

## 2020-06-17 DIAGNOSIS — R7309 Other abnormal glucose: Secondary | ICD-10-CM

## 2020-06-17 DIAGNOSIS — E034 Atrophy of thyroid (acquired): Secondary | ICD-10-CM | POA: Diagnosis not present

## 2020-06-17 DIAGNOSIS — G43809 Other migraine, not intractable, without status migrainosus: Secondary | ICD-10-CM

## 2020-06-17 DIAGNOSIS — E663 Overweight: Secondary | ICD-10-CM

## 2020-06-17 LAB — COMPREHENSIVE METABOLIC PANEL
ALT: 21 U/L (ref 0–35)
AST: 18 U/L (ref 0–37)
Albumin: 4.3 g/dL (ref 3.5–5.2)
Alkaline Phosphatase: 41 U/L (ref 39–117)
BUN: 22 mg/dL (ref 6–23)
CO2: 29 mEq/L (ref 19–32)
Calcium: 8.9 mg/dL (ref 8.4–10.5)
Chloride: 102 mEq/L (ref 96–112)
Creatinine, Ser: 0.89 mg/dL (ref 0.40–1.20)
GFR: 77.1 mL/min (ref >60.00)
Glucose, Bld: 90 mg/dL (ref 70–99)
Potassium: 4 mEq/L (ref 3.5–5.1)
Sodium: 137 mEq/L (ref 135–145)
Total Bilirubin: 0.5 mg/dL (ref 0.2–1.2)
Total Protein: 6.8 g/dL (ref 6.0–8.3)

## 2020-06-17 LAB — CBC
HCT: 42.8 % (ref 36.0–46.0)
Hemoglobin: 13.9 g/dL (ref 12.0–15.0)
MCHC: 32.6 g/dL (ref 30.0–36.0)
MCV: 91.6 fl (ref 78.0–100.0)
Platelets: 222 10*3/uL (ref 150.0–400.0)
RBC: 4.67 Mil/uL (ref 3.87–5.11)
RDW: 12.9 % (ref 11.5–15.5)
WBC: 6.6 10*3/uL (ref 4.0–10.5)

## 2020-06-17 LAB — LIPID PANEL
Cholesterol: 172 mg/dL (ref 0–200)
HDL: 53.9 mg/dL (ref >39.00)
LDL Cholesterol: 102 mg/dL — ABNORMAL HIGH (ref 0–99)
NonHDL: 118.23
Total CHOL/HDL Ratio: 3
Triglycerides: 82 mg/dL (ref 0.0–149.0)
VLDL: 16.4 mg/dL (ref 0.0–40.0)

## 2020-06-17 LAB — TSH: TSH: 2.67 u[IU]/mL (ref 0.35–4.50)

## 2020-06-17 LAB — HEMOGLOBIN A1C: Hgb A1c MFr Bld: 5.9 % (ref 4.6–6.5)

## 2020-06-17 MED ORDER — PRAVASTATIN SODIUM 20 MG PO TABS
20.0000 mg | ORAL_TABLET | Freq: Every day | ORAL | 3 refills | Status: DC
Start: 2020-06-17 — End: 2020-08-22

## 2020-06-17 MED ORDER — SUMATRIPTAN SUCCINATE 50 MG PO TABS
ORAL_TABLET | ORAL | 5 refills | Status: DC
Start: 2020-06-17 — End: 2021-06-18

## 2020-06-17 NOTE — Progress Notes (Signed)
This visit occurred during the SARS-CoV-2 public health emergency.  Safety protocols were in place, including screening questions prior to the visit, additional usage of staff PPE, and extensive cleaning of exam room while observing appropriate contact time as indicated for disinfecting solutions.    Patient ID: Ann Cain, female  DOB: 1970/09/28, 49 y.o.   MRN: [id number] Patient Care Team    Relationship Specialty Notifications Start End  Ma Hillock, DO PCP - General Family Medicine  10/30/15   Aloha Gell, MD Consulting Physician Obstetrics and Gynecology  10/30/15   Carol Ada, MD Consulting Physician Gastroenterology  01/14/17   Richmond Campbell, MD Consulting Physician Gastroenterology  02/27/18   Debbora Presto, NP Nurse Practitioner Family Medicine  03/06/19     Chief Complaint  Patient presents with  . Annual Exam    pt is fasting    Subjective:  Ann Cain is a 49 y.o.  Female  present for CPE. All past medical history, surgical history, allergies, family history, immunizations, medications and social history were updated in the electronic medical record today. All recent labs, ED visits and hospitalizations within the last year were reviewed.  Health maintenance:  Colonoscopy: 04/28/2017; h/o adenoma- 3 year follow-up Dr. Earlean Shawl  Mammogram:07/2019 GYN  gynecologist([facility]).  Cervical cancer screening:2018, hysterectomy, [facility]  Immunizations: tdapUTD 12/2015, declined flu. covid series completed Infectious disease screening: HIV with pregnancy 2014. Hep C collected today per pt request DEXA:Not indicated  Assistive device: none Oxygen XVQ:MGQQ Patient has a Dental home. Hospitalizations/ED visits: reviewed  Hypothyrpodism/hyperlipidemia/prediabetes: Patient reports she istaking3-4 grams of the Nordic natural omega-3 solution.She reports compliance with levothyroxine 100 mcg QD.She is not exercising routinely. She has made dietary changes  over the last year.   Migraine: Her migraines are usually right sided from posterior neck to right eye. She was tried on maxalt in the past and did not feel it worked great for her. She typically has about 2 -3 migraines a year. She will take OTC products to manage and lay down.  She believes it is from the extreme weather changes/pressure. She reports Imitrex is helpful. She does not like the way it makes her feel.    Depression screen [facility] 2/9 06/17/2020 03/06/2019 02/27/2018 01/14/2017 12/22/2015  Decreased Interest 0 0 0 0 0  Down, Depressed, Hopeless 0 0 0 0 0  PHQ - 2 Score 0 0 0 0 0   GAD 7 : Generalized Anxiety Score 12/17/2015  Nervous, Anxious, on Edge 2  Control/stop worrying 0  Worry too much - different things 0  Trouble relaxing 1  Restless 0  Easily annoyed or irritable 1  Afraid - awful might happen 1  Total GAD 7 Score 5    Immunization History  Administered Date(s) Administered  . Influenza,inj,Quad PF,6+ Mos 06/16/2017  . PFIZER SARS-COV-2 Vaccination 02/14/2020, 03/06/2020  . Tdap 12/22/2015   Past Medical History:  Diagnosis Date  . Acid reflux 2012   egd  . Allergic rhinitis    [place] allergy/asthma patient  . Allergy   . Anal fissure   . Anemia    prior to hysterectomy   . Asthma   . Colon polyp 2015   tubular adenoma  . Dysrhythmia   . Elevated hemoglobin A1c   . Fibroid uterus   . 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 Hemorrhoid   . History of chicken pox   . History of dizziness   . Hypertension   . Hypothyroidism   .  Migraines   . Rectal bleeding    No Known Allergies Past Surgical History:  Procedure Laterality Date  . 48-Hour Cardiac Event Monitor  01/2016   Normal study. Sinus rhythm with artifact. No PACs, PVCs or arrhythmias.  . ABDOMINAL HYSTERECTOMY    . CESAREAN SECTION  2002, 2004  . CHOLECYSTECTOMY N/A 02/19/2016   Procedure: LAPAROSCOPIC CHOLECYSTECTOMY;  Surgeon: Coralie Keens, MD;  Location: [facility];  Service: General;   Laterality: N/A;  . COLONOSCOPY  2015   Dr. Claudie Leach internal hemorrhoids, 2 polyps  . DENTAL SURGERY    . TRANSTHORACIC ECHOCARDIOGRAM  01/21/2016   EF 5560%. No RWMA. Normal Valves. No MVP. No PFO/ASD  . Treadmill Stress Test  01/27/2016   Reached 10.10 METS  Hypertensive response to exercise. No ST deviation. No T-wave inversion. Baseline ST depressions in inferolateral leads remain the same distress.  NEGATIVE GXT   Family History  Problem Relation Age of Onset  . Kidney cancer Father 72  . Lung cancer Maternal Grandfather   . Colon cancer Paternal Grandmother 51  . Heart disease Paternal Grandmother   . Heart disease Paternal Grandfather    Social History   Social History Narrative   Married to [place]. 3 children (one set of twins). 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 [name], [name].   College education. Masters degree, stay-at-home mother.   No tobacco use, no recreational drugs, occasional alcohol.   Drinks caffeinated beverages, takes a daily vitamin   Wears her seatbelt and bicycle helmet   Smoke detector in the home    Feels safe in her relationship.    Allergies as of 06/17/2020   No Known Allergies     Medication List       Accurate as of June 17, 2020  8:43 AM. If you have any questions, ask your nurse or doctor.        STOP taking these medications   hydrOXYzine 25 MG capsule Commonly known as: Vistaril Stopped by: Howard Pouch, DO     TAKE these medications   ALLERGY 24-HR PO Take 1 tablet by mouth daily.   b complex vitamins tablet Take 1 tablet by mouth daily.   CVS TURMERIC CURCUMIN PO Take 1 tablet by mouth daily.   estradiol 0.1 MG/24HR patch Commonly known as: VIVELLE-DOT APPLY 1 PATCH TO THE SKIN TWICE A WEEK.   levothyroxine 100 MCG tablet Commonly known as: SYNTHROID TAKE 1 TABLET DAILY BEFORE BREAKFAST   NAC 600 MG Caps Generic drug: Acetylcysteine Take 1 capsule by mouth daily.   Omega 3 1200 MG Caps Take 1 capsule by mouth  daily. 1560 mg daily   pravastatin 20 MG tablet Commonly known as: PRAVACHOL Take 1 tablet (20 mg total) by mouth daily.   ProAir RespiClick 542 (90 Base) MCG/ACT Aepb Generic drug: Albuterol Sulfate Inhale 2 puffs into the lungs every 6 (six) hours as needed (wheezing).   spironolactone 50 MG tablet Commonly known as: ALDACTONE Take 50 mg by mouth daily.   SUMAtriptan 50 MG tablet Commonly known as: Imitrex May repeat in 2 hours once if headache persists or recurs.   tretinoin 0.025 % cream Commonly known as: RETIN-A APPLY 1 APPLICATION A PEARL-SIZED AMOUNT TO FACE IN THE EVENING ONCE A DAY   valACYclovir 500 MG tablet Commonly known as: VALTREX TAKE 1 TABLET BY MOUTH DAILY AS PREVENTION, INCREASE TO TWICE A DAY FOR 3 DAYS WITH LESIONS   vitamin C 250 MG tablet Commonly known as: ASCORBIC ACID Take 250 mg by  mouth 2 (two) times daily.   vitamin D3 50 MCG (2000 UT) Caps Take 1 tablet by mouth.       All past medical history, surgical history, allergies, family history, immunizations andmedications were updated in the EMR today and reviewed under the history and medication portions of their EMR.     No results found for this or any previous visit (from the past 2160 hour(s)).  No results found.   ROS: 14 pt review of systems performed and negative (unless mentioned in an HPI)  Objective: BP 112/79   Pulse 64   Temp 98.2 F (36.8 C) (Oral)   Ht 5' 9.75" (1.772 m)   Wt 193 lb (87.5 kg)   SpO2 100%   BMI 27.89 kg/m  Gen: Afebrile. No acute distress. Nontoxic in appearance, well-developed, well-nourished,  Pleasant female.  HENT: AT. [place]. Bilateral TM visualized and normal in appearance, normal external auditory canal. MMM, no oral lesions, adequate dentition. Bilateral nares within normal limits. Throat without erythema, ulcerations or exudates. no Cough on exam, no hoarseness on exam. Eyes:Pupils Equal Round Reactive to light, Extraocular movements intact,   Conjunctiva without redness, discharge or icterus. Neck/lymp/endocrine: Supple,no lymphadenopathy, no thyromegaly CV: RRR no murmur, no edema, +2/4 P posterior tibialis pulses.  Chest: CTAB, no wheeze, rhonchi or crackles. normal Respiratory effort. good Air movement. Abd: Soft. flat. NTND. BS present. no Masses palpated. No hepatosplenomegaly. No rebound tenderness or guarding. Skin: no rashes, purpura or petechiae. Warm and well-perfused. Skin intact. Neuro/Msk:  Normal gait. PERLA. EOMi. Alert. Oriented x3.  Cranial nerves II through XII intact. Muscle strength 5/5 upper/lower extremity. DTRs equal bilaterally. Psych: Normal affect, dress and demeanor. Normal speech. Normal thought content and judgment.   No exam data present  Assessment/plan: Ann Cain is a 49 y.o. female present for  CPE  Hyperlipidemia, unspecified hyperlipidemia type/overweight Continue Omega-3.  Continue pravastatin.  CBC, Cmp, lipid panel collected Diet and exercise encouraged  Hypothyroidism, unspecified type Continue synthroid 100 mcg, unless lab indicate need to alter dose.  TSH collected today  Migraine:  Stable Continue imitrex (can try 1/2 tab)   Elevated hemoglobin A1cOverweight (BMI 25.0-29.9)- Hemoglobin A1c  Need for hepatitis C screening test - Hepatitis C Antibody  Routine general medical examination at a health care facility Patient was encouraged to exercise greater than 150 minutes a week. Patient was encouraged to choose a diet filled with fresh fruits and vegetables, and lean meats. AVS provided to patient today for education/recommendation on gender specific health and safety maintenance. Colonoscopy: 04/28/2017; h/o adenoma- 3 year follow-up Dr. Earlean Shawl  Mammogram:07/2019 GYN  gynecologist([facility]).  Cervical cancer screening:2018, hysterectomy, [facility]  Immunizations: tdapUTD 12/2015, declined flu. covid series completed Infectious disease screening: HIV with  pregnancy 2014. Hep C collected today per pt request  Return in about 1 year (around 06/17/2021) for CPE (30 min).  Orders Placed This Encounter  Procedures  . Comprehensive metabolic panel  . Hemoglobin A1c  . Lipid panel  . TSH  . Hepatitis C Antibody  . CBC   Meds ordered this encounter  Medications  . SUMAtriptan (IMITREX) 50 MG tablet    Sig: May repeat in 2 hours once if headache persists or recurs.    Dispense:  10 tablet    Refill:  5  . pravastatin (PRAVACHOL) 20 MG tablet    Sig: Take 1 tablet (20 mg total) by mouth daily.    Dispense:  90 tablet    Refill:  3  MUST HAVE OV FOR FURTHER REFILLS   Referral Orders  No referral(s) requested today     Electronically signed by: Howard Pouch, DO [place]

## 2020-06-17 NOTE — Patient Instructions (Signed)
Health Maintenance, Female Adopting a healthy lifestyle and getting preventive care are important in promoting health and wellness. Ask your health care provider about:  The right schedule for you to have regular tests and exams.  Things you can do on your own to prevent diseases and keep yourself healthy. What should I know about diet, weight, and exercise? Eat a healthy diet   Eat a diet that includes plenty of vegetables, fruits, low-fat dairy products, and lean protein.  Do not eat a lot of foods that are high in solid fats, added sugars, or sodium. Maintain a healthy weight Body mass index (BMI) is used to identify weight problems. It estimates body fat based on height and weight. Your health care provider can help determine your BMI and help you achieve or maintain a healthy weight. Get regular exercise Get regular exercise. This is one of the most important things you can do for your health. Most adults should:  Exercise for at least 150 minutes each week. The exercise should increase your heart rate and make you sweat (moderate-intensity exercise).  Do strengthening exercises at least twice a week. This is in addition to the moderate-intensity exercise.  Spend less time sitting. Even light physical activity can be beneficial. Watch cholesterol and blood lipids Have your blood tested for lipids and cholesterol at 47 years of age, then have this test every 5 years. Have your cholesterol levels checked more often if:  Your lipid or cholesterol levels are high.  You are older than 47 years of age.  You are at high risk for heart disease. What should I know about cancer screening? Depending on your health history and family history, you may need to have cancer screening at various ages. This may include screening for:  Breast cancer.  Cervical cancer.  Colorectal cancer.  Skin cancer.  Lung cancer. What should I know about heart disease, diabetes, and high blood  pressure? Blood pressure and heart disease  High blood pressure causes heart disease and increases the risk of stroke. This is more likely to develop in people who have high blood pressure readings, are of African descent, or are overweight.  Have your blood pressure checked: ? Every 3-5 years if you are 18-39 years of age. ? Every year if you are 40 years old or older. Diabetes Have regular diabetes screenings. This checks your fasting blood sugar level. Have the screening done:  Once every three years after age 40 if you are at a normal weight and have a low risk for diabetes.  More often and at a younger age if you are overweight or have a high risk for diabetes. What should I know about preventing infection? Hepatitis B If you have a higher risk for hepatitis B, you should be screened for this virus. Talk with your health care provider to find out if you are at risk for hepatitis B infection. Hepatitis C Testing is recommended for:  Everyone born from 1945 through 1965.  Anyone with known risk factors for hepatitis C. Sexually transmitted infections (STIs)  Get screened for STIs, including gonorrhea and chlamydia, if: ? You are sexually active and are younger than 47 years of age. ? You are older than 47 years of age and your health care provider tells you that you are at risk for this type of infection. ? Your sexual activity has changed since you were last screened, and you are at increased risk for chlamydia or gonorrhea. Ask your health care provider if   you are at risk.  Ask your health care provider about whether you are at high risk for HIV. Your health care provider may recommend a prescription medicine to help prevent HIV infection. If you choose to take medicine to prevent HIV, you should first get tested for HIV. You should then be tested every 3 months for as long as you are taking the medicine. Pregnancy  If you are about to stop having your period (premenopausal) and  you may become pregnant, seek counseling before you get pregnant.  Take 400 to 800 micrograms (mcg) of folic acid every day if you become pregnant.  Ask for birth control (contraception) if you want to prevent pregnancy. Osteoporosis and menopause Osteoporosis is a disease in which the bones lose minerals and strength with aging. This can result in bone fractures. If you are 65 years old or older, or if you are at risk for osteoporosis and fractures, ask your health care provider if you should:  Be screened for bone loss.  Take a calcium or vitamin D supplement to lower your risk of fractures.  Be given hormone replacement therapy (HRT) to treat symptoms of menopause. Follow these instructions at home: Lifestyle  Do not use any products that contain nicotine or tobacco, such as cigarettes, e-cigarettes, and chewing tobacco. If you need help quitting, ask your health care provider.  Do not use street drugs.  Do not share needles.  Ask your health care provider for help if you need support or information about quitting drugs. Alcohol use  Do not drink alcohol if: ? Your health care provider tells you not to drink. ? You are pregnant, may be pregnant, or are planning to become pregnant.  If you drink alcohol: ? Limit how much you use to 0-1 drink a day. ? Limit intake if you are breastfeeding.  Be aware of how much alcohol is in your drink. In the [place] one drink equals one 12 oz bottle of beer (355 mL), one 5 oz glass of wine (148 mL), or one 1 oz glass of hard liquor (44 mL). General instructions  Schedule regular health, dental, and eye exams.  Stay current with your vaccines.  Tell your health care provider if: ? You often feel depressed. ? You have ever been abused or do not feel safe at home. Summary  Adopting a healthy lifestyle and getting preventive care are important in promoting health and wellness.  Follow your health care provider's instructions about healthy  diet, exercising, and getting tested or screened for diseases.  Follow your health care provider's instructions on monitoring your cholesterol and blood pressure. This information is not intended to replace advice given to you by your health care provider. Make sure you discuss any questions you have with your health care provider. Document Revised: 07/12/2018 Document Reviewed: 07/12/2018 Elsevier Patient Education  2020 Elsevier Inc.  

## 2020-06-18 ENCOUNTER — Encounter: Payer: Self-pay | Admitting: Family Medicine

## 2020-06-18 LAB — HEPATITIS C ANTIBODY
Hepatitis C Ab: NONREACTIVE
SIGNAL TO CUT-OFF: 0.01 (ref <1.00)

## 2020-07-28 LAB — HM MAMMOGRAPHY

## 2020-07-29 ENCOUNTER — Telehealth: Payer: Self-pay

## 2020-07-29 NOTE — Telephone Encounter (Signed)
Received Mammogram results from [facility] on 07/29/20. Placed on PCP desk for review. 

## 2020-08-08 ENCOUNTER — Other Ambulatory Visit: Payer: Self-pay | Admitting: Family Medicine

## 2020-08-22 ENCOUNTER — Other Ambulatory Visit: Payer: Self-pay

## 2020-08-22 MED ORDER — PRAVASTATIN SODIUM 20 MG PO TABS: 20.0000 mg | ORAL_TABLET | Freq: Every day | ORAL | 1 refills | Status: DC

## 2020-08-26 ENCOUNTER — Encounter: Payer: Self-pay | Admitting: Family Medicine

## 2020-09-02 ENCOUNTER — Other Ambulatory Visit: Payer: Self-pay

## 2020-09-03 ENCOUNTER — Ambulatory Visit (INDEPENDENT_AMBULATORY_CARE_PROVIDER_SITE_OTHER): Payer: 59 | Admitting: Family Medicine

## 2020-09-03 ENCOUNTER — Encounter: Payer: Self-pay | Admitting: Family Medicine

## 2020-09-03 VITALS — BP 138/93 | HR 78 | Temp 98.0°F | Ht 69.75 in | Wt 195.0 lb

## 2020-09-03 DIAGNOSIS — K601 Chronic anal fissure: Secondary | ICD-10-CM | POA: Insufficient documentation

## 2020-09-03 DIAGNOSIS — X509XXA Other and unspecified overexertion or strenuous movements or postures, initial encounter: Secondary | ICD-10-CM

## 2020-09-03 DIAGNOSIS — R000 Tachycardia, unspecified: Secondary | ICD-10-CM | POA: Diagnosis not present

## 2020-09-03 DIAGNOSIS — K6289 Other specified diseases of anus and rectum: Secondary | ICD-10-CM | POA: Insufficient documentation

## 2020-09-03 DIAGNOSIS — K625 Hemorrhage of anus and rectum: Secondary | ICD-10-CM | POA: Insufficient documentation

## 2020-09-03 NOTE — Patient Instructions (Signed)
Monitor your blood pressure- goal ideally 120s/70s, but 132/82. Hydrate. Avoid high sodium meals.   If above goal> make follow up and we will start med .     Low-Sodium Eating Plan Sodium, which is an element that makes up salt, helps you maintain a healthy balance of fluids in your body. Too much sodium can increase your blood pressure and cause fluid and waste to be held in your body. Your health care provider or dietitian may recommend following this plan if you have high blood pressure (hypertension), kidney disease, liver disease, or heart failure. Eating less sodium can help lower your blood pressure, reduce swelling, and protect your heart, liver, and kidneys. What are tips for following this plan? Reading food labels  The Nutrition Facts label lists the amount of sodium in one serving of the food. If you eat more than one serving, you must multiply the listed amount of sodium by the number of servings.  Choose foods with less than 140 mg of sodium per serving.  Avoid foods with 300 mg of sodium or more per serving. Shopping  Look for lower-sodium products, often labeled as "low-sodium" or "no salt added."  Always check the sodium content, even if foods are labeled as "unsalted" or "no salt added."  Buy fresh foods. ? Avoid canned foods and pre-made or frozen meals. ? Avoid canned, cured, or processed meats.  Buy breads that have less than 80 mg of sodium per slice.   Cooking  Eat more home-cooked food and less restaurant, buffet, and fast food.  Avoid adding salt when cooking. Use salt-free seasonings or herbs instead of table salt or sea salt. Check with your health care provider or pharmacist before using salt substitutes.  Cook with plant-based oils, such as canola, sunflower, or olive oil.   Meal planning  When eating at a restaurant, ask that your food be prepared with less salt or no salt, if possible. Avoid dishes labeled as brined, pickled, cured, smoked, or  made with soy sauce, miso, or teriyaki sauce.  Avoid foods that contain MSG (monosodium glutamate). MSG is sometimes added to [place] food, bouillon, and some canned foods.  Make meals that can be grilled, baked, poached, roasted, or steamed. These are generally made with less sodium. General information Most people on this plan should limit their sodium intake to 1,500-2,000 mg (milligrams) of sodium each day. What foods should I eat? Fruits Fresh, frozen, or canned fruit. Fruit juice. Vegetables Fresh or frozen vegetables. "No salt added" canned vegetables. "No salt added" tomato sauce and paste. Low-sodium or reduced-sodium tomato and vegetable juice. Grains Low-sodium cereals, including oats, puffed wheat and rice, and shredded wheat. Low-sodium crackers. Unsalted rice. Unsalted pasta. Low-sodium bread. Whole-grain breads and whole-grain pasta. Meats and other proteins Fresh or frozen (no salt added) meat, poultry, seafood, and fish. Low-sodium canned tuna and salmon. Unsalted nuts. Dried peas, beans, and lentils without added salt. Unsalted canned beans. Eggs. Unsalted nut butters. Dairy Milk. Soy milk. Cheese that is naturally low in sodium, such as ricotta cheese, fresh mozzarella, or Swiss cheese. Low-sodium or reduced-sodium cheese. Cream cheese. Yogurt. Seasonings and condiments Fresh and dried herbs and spices. Salt-free seasonings. Low-sodium mustard and ketchup. Sodium-free salad dressing. Sodium-free light mayonnaise. Fresh or refrigerated horseradish. Lemon juice. Vinegar. Other foods Homemade, reduced-sodium, or low-sodium soups. Unsalted popcorn and pretzels. Low-salt or salt-free chips. The items listed above may not be a complete list of foods and beverages you can eat. Contact a dietitian for more  information. What foods should I avoid? Vegetables Sauerkraut, pickled vegetables, and relishes. Olives. [place] fries. Onion rings. Regular canned vegetables (not low-sodium or  reduced-sodium). Regular canned tomato sauce and paste (not low-sodium or reduced-sodium). Regular tomato and vegetable juice (not low-sodium or reduced-sodium). Frozen vegetables in sauces. Grains Instant hot cereals. Bread stuffing, pancake, and biscuit mixes. Croutons. Seasoned rice or pasta mixes. Noodle soup cups. Boxed or frozen macaroni and cheese. Regular salted crackers. Self-rising flour. Meats and other proteins Meat or fish that is salted, canned, smoked, spiced, or pickled. Precooked or cured meat, such as sausages or meat loaves. Berniece Salines. Ham. Pepperoni. Hot dogs. Corned beef. Chipped beef. Salt pork. Jerky. Pickled herring. Anchovies and sardines. Regular canned tuna. Salted nuts. Dairy Processed cheese and cheese spreads. Hard cheeses. Cheese curds. Blue cheese. Feta cheese. String cheese. Regular cottage cheese. Buttermilk. Canned milk. Fats and oils Salted butter. Regular margarine. Ghee. Bacon fat. Seasonings and condiments Onion salt, garlic salt, seasoned salt, table salt, and sea salt. Canned and packaged gravies. Worcestershire sauce. Tartar sauce. Barbecue sauce. Teriyaki sauce. Soy sauce, including reduced-sodium. Steak sauce. Fish sauce. Oyster sauce. Cocktail sauce. Horseradish that you find on the shelf. Regular ketchup and mustard. Meat flavorings and tenderizers. Bouillon cubes. Hot sauce. Pre-made or packaged marinades. Pre-made or packaged taco seasonings. Relishes. Regular salad dressings. Salsa. Other foods Salted popcorn and pretzels. Corn chips and puffs. Potato and tortilla chips. Canned or dried soups. Pizza. Frozen entrees and pot pies. The items listed above may not be a complete list of foods and beverages you should avoid. Contact a dietitian for more information. Summary  Eating less sodium can help lower your blood pressure, reduce swelling, and protect your heart, liver, and kidneys.  Most people on this plan should limit their sodium intake to 1,500-2,000  mg (milligrams) of sodium each day.  Canned, boxed, and frozen foods are high in sodium. Restaurant foods, fast foods, and pizza are also very high in sodium. You also get sodium by adding salt to food.  Try to cook at home, eat more fresh fruits and vegetables, and eat less fast food and canned, processed, or prepared foods. This information is not intended to replace advice given to you by your health care provider. Make sure you discuss any questions you have with your health care provider. Document Revised: 08/24/2019 Document Reviewed: 06/20/2019 Elsevier Patient Education  2021 [employer].
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
## 2021-06-10 ENCOUNTER — Telehealth: Payer: Self-pay

## 2021-06-10 MED ORDER — LEVOTHYROXINE SODIUM 100 MCG PO TABS: ORAL_TABLET | ORAL | 0 refills | Status: DC

## 2021-06-10 NOTE — Telephone Encounter (Signed)
Rx sent 

## 2021-06-10 NOTE — Telephone Encounter (Signed)
Patient refill request.  Patient is out of meds.  She is scheduled for her CPE with Dr. Raoul Pitch on 06/18/21. Requesting enough meds to get her to her visit next week.  [facility]   levothyroxine (SYNTHROID) 100 MCG tablet [[id number]]

## 2021-06-17 ENCOUNTER — Other Ambulatory Visit: Payer: Self-pay

## 2021-06-18 ENCOUNTER — Ambulatory Visit (INDEPENDENT_AMBULATORY_CARE_PROVIDER_SITE_OTHER): Payer: 59 | Admitting: Family Medicine

## 2021-06-18 ENCOUNTER — Encounter: Payer: Self-pay | Admitting: Family Medicine

## 2021-06-18 VITALS — BP 126/84 | HR 69 | Temp 98.4°F | Ht 69.0 in | Wt 194.0 lb

## 2021-06-18 DIAGNOSIS — Z0000 Encounter for general adult medical examination without abnormal findings: Secondary | ICD-10-CM

## 2021-06-18 DIAGNOSIS — E785 Hyperlipidemia, unspecified: Secondary | ICD-10-CM | POA: Diagnosis not present

## 2021-06-18 DIAGNOSIS — E663 Overweight: Secondary | ICD-10-CM | POA: Diagnosis not present

## 2021-06-18 DIAGNOSIS — Z86010 Personal history of colonic polyps: Secondary | ICD-10-CM

## 2021-06-18 DIAGNOSIS — G43809 Other migraine, not intractable, without status migrainosus: Secondary | ICD-10-CM

## 2021-06-18 DIAGNOSIS — E034 Atrophy of thyroid (acquired): Secondary | ICD-10-CM | POA: Diagnosis not present

## 2021-06-18 DIAGNOSIS — R7309 Other abnormal glucose: Secondary | ICD-10-CM | POA: Diagnosis not present

## 2021-06-18 DIAGNOSIS — E559 Vitamin D deficiency, unspecified: Secondary | ICD-10-CM | POA: Diagnosis not present

## 2021-06-18 DIAGNOSIS — Z860101 Personal history of adenomatous and serrated colon polyps: Secondary | ICD-10-CM

## 2021-06-18 DIAGNOSIS — Z79899 Other long term (current) drug therapy: Secondary | ICD-10-CM

## 2021-06-18 LAB — CBC
HCT: 42.3 % (ref 36.0–46.0)
Hemoglobin: 13.8 g/dL (ref 12.0–15.0)
MCHC: 32.7 g/dL (ref 30.0–36.0)
MCV: 90.8 fl (ref 78.0–100.0)
Platelets: 247 10*3/uL (ref 150.0–400.0)
RBC: 4.65 Mil/uL (ref 3.87–5.11)
RDW: 12.9 % (ref 11.5–15.5)
WBC: 6.7 10*3/uL (ref 4.0–10.5)

## 2021-06-18 LAB — T4, FREE: Free T4: 0.99 ng/dL (ref 0.60–1.60)

## 2021-06-18 LAB — COMPREHENSIVE METABOLIC PANEL
ALT: 21 U/L (ref 0–35)
AST: 19 U/L (ref 0–37)
Albumin: 4.4 g/dL (ref 3.5–5.2)
Alkaline Phosphatase: 52 U/L (ref 39–117)
BUN: 20 mg/dL (ref 6–23)
CO2: 30 mEq/L (ref 19–32)
Calcium: 9.1 mg/dL (ref 8.4–10.5)
Chloride: 102 mEq/L (ref 96–112)
Creatinine, Ser: 0.98 mg/dL (ref 0.40–1.20)
GFR: 68.2 mL/min (ref >60.00)
Glucose, Bld: 90 mg/dL (ref 70–99)
Potassium: 4.4 mEq/L (ref 3.5–5.1)
Sodium: 138 mEq/L (ref 135–145)
Total Bilirubin: 0.6 mg/dL (ref 0.2–1.2)
Total Protein: 6.9 g/dL (ref 6.0–8.3)

## 2021-06-18 LAB — LIPID PANEL
Cholesterol: 161 mg/dL (ref 0–200)
HDL: 48.5 mg/dL (ref >39.00)
LDL Cholesterol: 90 mg/dL (ref 0–99)
NonHDL: 112.14
Total CHOL/HDL Ratio: 3
Triglycerides: 109 mg/dL (ref 0.0–149.0)
VLDL: 21.8 mg/dL (ref 0.0–40.0)

## 2021-06-18 LAB — HEMOGLOBIN A1C: Hgb A1c MFr Bld: 6 % (ref 4.6–6.5)

## 2021-06-18 LAB — TSH: TSH: 1.97 u[IU]/mL (ref 0.35–5.50)

## 2021-06-18 LAB — VITAMIN D 25 HYDROXY (VIT D DEFICIENCY, FRACTURES): VITD: 22.67 ng/mL — ABNORMAL LOW (ref 30.00–100.00)

## 2021-06-18 MED ORDER — SUMATRIPTAN SUCCINATE 50 MG PO TABS: ORAL_TABLET | ORAL | 5 refills | Status: AC

## 2021-06-18 MED ORDER — PRAVASTATIN SODIUM 20 MG PO TABS: 20.0000 mg | ORAL_TABLET | Freq: Every day | ORAL | 1 refills | Status: DC

## 2021-06-18 NOTE — Progress Notes (Signed)
This visit occurred during the SARS-CoV-2 public health emergency.  Safety protocols were in place, including screening questions prior to the visit, additional usage of staff PPE, and extensive cleaning of exam room while observing appropriate contact time as indicated for disinfecting solutions.    Patient ID: Ann Cain, female  DOB: 1971-07-16, 50 y.o.   MRN: [id number] Patient Care Team    Relationship Specialty Notifications Start End  Ann Hillock, DO PCP - General Family Medicine  10/30/15   Ann Gell, MD Consulting Physician Obstetrics and Gynecology  10/30/15   Ann Ada, MD Consulting Physician Gastroenterology  01/14/17   Ann Campbell, MD Consulting Physician Gastroenterology  02/27/18   Ann Presto, NP Nurse Practitioner Family Medicine  03/06/19     Chief Complaint  Patient presents with   Annual Exam    Pt is fasting    Subjective: Ann Cain is a 50 y.o.  Female  present for CPE/CMC. All past medical history, surgical history, allergies, family history, immunizations, medications and social history were updated in the electronic medical record today. All recent labs, ED visits and hospitalizations within the last year were reviewed.  Health maintenance:  Colonoscopy: 04/28/2017; h/o adenoma- 5 year follow-up Ann Cain  Mammogram: 07/2020 GYN-gynecologist ([facility]).  Cervical cancer screening: 2018, hysterectomy, [facility]  Immunizations: tdap UTD 12/2015, declined flu. covid series completed Infectious disease screening: HIV with pregnancy 2014. Hep C completed DEXA:routine screen at 60-65 Assistive device: no Oxygen use:no Patient has a Dental home. Hospitalizations/ED visits: reviewed    Hypothyrpodism/hyperlipidemia/prediabetes: Patient reports she is taking  3-4 grams of the Nordic natural omega-3 solution. She reports compliance with levothyroxine 100 mcg QD.    Migraine: Stable with imitrex.  Prior note: Her migraines are  usually right sided from posterior neck to right eye. She was tried on maxalt in the past and did not feel it worked great for her. She typically has about 2 -3 migraines a year. She will take OTC products to manage and lay down.  She believes it is from the extreme weather changes/pressure. She reports Imitrex is helpful. She does not like the way it makes her feel.    Depression screen [facility] 2/9 06/18/2021 06/17/2020 03/06/2019 02/27/2018 01/14/2017  Decreased Interest 0 0 0 0 0  Down, Depressed, Hopeless 0 0 0 0 0  PHQ - 2 Score 0 0 0 0 0   GAD 7 : Generalized Anxiety Score 12/17/2015  Nervous, Anxious, on Edge 2  Control/stop worrying 0  Worry too much - different things 0  Trouble relaxing 1  Restless 0  Easily annoyed or irritable 1  Afraid - awful might happen 1  Total GAD 7 Score 5   Immunization History  Administered Date(s) Administered   Influenza,inj,Quad PF,6+ Mos 06/16/2017   PFIZER(Purple Top)SARS-COV-2 Vaccination 02/14/2020, 03/06/2020   Tdap 12/22/2015   Past Medical History:  Diagnosis Date   Acid reflux 2012   egd   Allergic rhinitis    [place] allergy/asthma patient   Allergy    Anal fissure    Anemia    prior to hysterectomy    Asthma    Colon polyp 2015   tubular adenoma   Dysrhythmia    Elevated hemoglobin A1c    Fibroid uterus    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 Sinus rhythm with artifact. No PACs, PVCs or arrhythmias.   ABDOMINAL HYSTERECTOMY     CESAREAN SECTION  2002, 2004   CHOLECYSTECTOMY N/A 02/19/2016   Procedure: LAPAROSCOPIC CHOLECYSTECTOMY;  Surgeon: Ann Keens, MD;  Location: [facility];  Service: General;  Laterality: N/A;   COLONOSCOPY  2015   Dr. Claudie Leach internal hemorrhoids, 2  polyps   DENTAL SURGERY     TRANSTHORACIC ECHOCARDIOGRAM  01/21/2016   EF 5560%. No RWMA. Normal Valves. No MVP. No PFO/ASD   Treadmill Stress Test  01/27/2016   Reached 10.10 METS  Hypertensive response to exercise. No ST deviation. No T-wave inversion. Baseline ST depressions in inferolateral leads remain the same distress.  NEGATIVE GXT   Family History  Problem Relation Age of Onset   Kidney cancer Father 10   Lung cancer Maternal Grandfather    Colon cancer Paternal Grandmother 96   Heart disease Paternal Grandmother    Heart disease Paternal Grandfather    Social History   Social History Narrative   Married to [place]. 3 children (one set of twins). 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 Ann Cain, Ann Cain.   College education. Masters degree, stay-at-home mother.   No tobacco use, no recreational drugs, occasional alcohol.   Drinks caffeinated beverages, takes a daily vitamin   Wears her seatbelt and bicycle helmet   Smoke detector in the home    Feels safe in her relationship.    Allergies as of 06/18/2021   No Known Allergies      Medication List        Accurate as of June 18, 2021  8:31 AM. If you have any questions, ask your nurse or doctor.          Albuterol Sulfate 108 (90 Base) MCG/ACT Aepb Commonly known as: PROAIR RESPICLICK Inhale 2 puffs into the lungs every 6 (six) hours as needed (wheezing).   ALLERGY 24-HR PO Take 1 tablet by mouth daily.   b complex vitamins tablet Take 1 tablet by mouth daily.   [employer] TURMERIC CURCUMIN PO Take 1 tablet by mouth daily.   estradiol 0.1 MG/24HR patch Commonly known as: VIVELLE-DOT APPLY 1 PATCH TO THE SKIN TWICE A WEEK.   levothyroxine 100 MCG tablet Commonly known as: SYNTHROID TAKE 1 TABLET DAILY BEFORE BREAKFAST (MUST HAVE OFFICE VISIT FOR FURTHER REFILLS)   Omega 3 1200 MG Caps Take 1 capsule by mouth daily. 1560 mg daily   pravastatin 20 MG tablet Commonly known as: PRAVACHOL Take 1 tablet (20 mg  total) by mouth daily.   spironolactone 50 MG tablet Commonly known as: ALDACTONE Take 50 mg by mouth daily.   SUMAtriptan 50 MG tablet Commonly known as: Imitrex May repeat in 2 hours once if headache persists or recurs.   tretinoin 0.025 % cream Commonly known as: RETIN-A APPLY 1 APPLICATION A PEARL-SIZED AMOUNT TO FACE IN THE EVENING ONCE A DAY   valACYclovir 500 MG tablet Commonly known as: VALTREX TAKE 1 TABLET BY MOUTH DAILY AS PREVENTION, INCREASE TO TWICE A DAY FOR 3 DAYS WITH LESIONS   vitamin C 250 MG tablet Commonly known as: ASCORBIC ACID Take 250 mg by mouth 2 (two) times daily.   vitamin D3 50 MCG (2000 UT) Caps Take 1 tablet by mouth.        All past medical history, surgical history, allergies, family history,  immunizations andmedications were updated in the EMR today and reviewed under the history and medication portions of their EMR.     No results found for this or any previous visit (from the past 2160 hour(s)).  No results found.   ROS: 14 pt review of systems performed and negative (unless mentioned in an HPI)  Objective: BP 126/84   Pulse 69   Temp 98.4 F (36.9 C) (Oral)   Ht 5\' 9"  (1.753 m)   Wt 194 lb (88 kg)   SpO2 99%   BMI 28.65 kg/m  Gen: Afebrile. No acute distress. Nontoxic in appearance, well-developed, well-nourished,  pleasant female.  HENT: AT. [place]. Bilateral TM visualized and normal in appearance, normal external auditory canal. MMM, no oral lesions, adequate dentition. Bilateral nares within normal limits. Throat without erythema, ulcerations or exudates. no Cough on exam, no hoarseness on exam. Eyes:Pupils Equal Round Reactive to light, Extraocular movements intact,  Conjunctiva without redness, discharge or icterus. Neck/lymp/endocrine: Supple,no lymphadenopathy, no thyromegaly CV: RRR no murmur, no edema, +2/4 P posterior tibialis pulses.  Chest: CTAB, no wheeze, rhonchi or crackles. normal Respiratory effort. good Air  movement. Abd: Soft. flat. NTND. BS present. no Masses palpated. No hepatosplenomegaly. No rebound tenderness or guarding. Skin: no rashes, purpura or petechiae. Warm and well-perfused. Skin intact. Neuro/Msk: Normal gait. PERLA. EOMi. Alert. Oriented x3.  Cranial nerves II through XII intact. Muscle strength 5/5 upper/lower extremity. DTRs equal bilaterally. Psych: Normal affect, dress and demeanor. Normal speech. Normal thought content and judgment.   No results found.  Assessment/plan: Bert Givans is a 50 y.o. female present for CPE/CMC Hyperlipidemia, unspecified hyperlipidemia type/overweight Continue  Omega-3.  Continue pravastatin.  CBC, Cmp, lipid panel collected today Diet and exercise encouraged   Hypothyroidism, unspecified type Continue   synthroid 100 mcg, unless lab indicate need to alter dose.  TSH collected today   Migraine:  Stable.  Continue imitrex (can try 1/2 tab)    Elevated hemoglobin A1cOverweight (BMI 25.0-29.9)-  Hemoglobin A1c collected today  H/O adenomatous polyp of colon Due 04/2022- encouraged her to contact her GI in the summer if she does nothear from them.   Encounter for preventive health examination Colonoscopy: 04/28/2017; h/o adenoma- 5 year follow-up Ann Cain  Mammogram: 07/2020 GYN-gynecologist ([facility]).  Cervical cancer screening: 2018, hysterectomy, [facility]  Immunizations: tdap UTD 12/2015, declined flu. covid series completed Infectious disease screening: HIV with pregnancy 2014. Hep C completed DEXA:routine screen at 60-65 Patient was encouraged to exercise greater than 150 minutes a week. Patient was encouraged to choose a diet filled with fresh fruits and vegetables, and lean meats. AVS provided to patient today for education/recommendation on gender specific health and safety maintenance.  Return in about 1 year (around 06/21/2022) for CPE (30 min), CMC (30 min).    Orders Placed This Encounter  Procedures    CBC   Comprehensive metabolic panel   Hemoglobin A1c   Lipid panel   TSH   T4, free   VITAMIN D 25 Hydroxy (Vit-D Deficiency, Fractures)   Meds ordered this encounter  Medications   pravastatin (PRAVACHOL) 20 MG tablet    Sig: Take 1 tablet (20 mg total) by mouth daily.    Dispense:  90 tablet    Refill:  1   SUMAtriptan (IMITREX) 50 MG tablet    Sig: May repeat in 2 hours once if headache persists or recurs.    Dispense:  10 tablet    Refill:  5   Referral Orders  No referral(s) requested today     Electronically signed by: Howard Pouch, [place]

## 2021-06-18 NOTE — Patient Instructions (Signed)
°Great to see you today.  °I have refilled the medication(s) we provide.  ° °If labs were collected, we will inform you of lab results once received either by echart message or telephone call.  ° - echart message- for normal results that have been seen by the patient already.  ° - telephone call: abnormal results or if patient has not viewed results in their echart. ° °Health Maintenance, Female °Adopting a healthy lifestyle and getting preventive care are important in promoting health and wellness. Ask your health care provider about: °The right schedule for you to have regular tests and exams. °Things you can do on your own to prevent diseases and keep yourself healthy. °What should I know about diet, weight, and exercise? °Eat a healthy diet ° °Eat a diet that includes plenty of vegetables, fruits, low-fat dairy products, and lean protein. °Do not eat a lot of foods that are high in solid fats, added sugars, or sodium. °Maintain a healthy weight °Body mass index (BMI) is used to identify weight problems. It estimates body fat based on height and weight. Your health care provider can help determine your BMI and help you achieve or maintain a healthy weight. °Get regular exercise °Get regular exercise. This is one of the most important things you can do for your health. Most adults should: °Exercise for at least 150 minutes each week. The exercise should increase your heart rate and make you sweat (moderate-intensity exercise). °Do strengthening exercises at least twice a week. This is in addition to the moderate-intensity exercise. °Spend less time sitting. Even light physical activity can be beneficial. °Watch cholesterol and blood lipids °Have your blood tested for lipids and cholesterol at 48 years of age, then have this test every 5 years. °Have your cholesterol levels checked more often if: °Your lipid or cholesterol levels are high. °You are older than 48 years of age. °You are at high risk for heart  disease. °What should I know about cancer screening? °Depending on your health history and family history, you may need to have cancer screening at various ages. This may include screening for: °Breast cancer. °Cervical cancer. °Colorectal cancer. °Skin cancer. °Lung cancer. °What should I know about heart disease, diabetes, and high blood pressure? °Blood pressure and heart disease °High blood pressure causes heart disease and increases the risk of stroke. This is more likely to develop in people who have high blood pressure readings or are overweight. °Have your blood pressure checked: °Every 3-5 years if you are 18-39 years of age. °Every year if you are 40 years old or older. °Diabetes °Have regular diabetes screenings. This checks your fasting blood sugar level. Have the screening done: °Once every three years after age 40 if you are at a normal weight and have a low risk for diabetes. °More often and at a younger age if you are overweight or have a high risk for diabetes. °What should I know about preventing infection? °Hepatitis B °If you have a higher risk for hepatitis B, you should be screened for this virus. Talk with your health care provider to find out if you are at risk for hepatitis B infection. °Hepatitis C °Testing is recommended for: °Everyone born from 1945 through 1965. °Anyone with known risk factors for hepatitis C. °Sexually transmitted infections (STIs) °Get screened for STIs, including gonorrhea and chlamydia, if: °You are sexually active and are younger than 48 years of age. °You are older than 48 years of age and your health care provider   tells you that you are at risk for this type of infection. °Your sexual activity has changed since you were last screened, and you are at increased risk for chlamydia or gonorrhea. Ask your health care provider if you are at risk. °Ask your health care provider about whether you are at high risk for HIV. Your health care provider may recommend a  prescription medicine to help prevent HIV infection. If you choose to take medicine to prevent HIV, you should first get tested for HIV. You should then be tested every 3 months for as long as you are taking the medicine. °Pregnancy °If you are about to stop having your period (premenopausal) and you may become pregnant, seek counseling before you get pregnant. °Take 400 to 800 micrograms (mcg) of folic acid every day if you become pregnant. °Ask for birth control (contraception) if you want to prevent pregnancy. °Osteoporosis and menopause °Osteoporosis is a disease in which the bones lose minerals and strength with aging. This can result in bone fractures. If you are 65 years old or older, or if you are at risk for osteoporosis and fractures, ask your health care provider if you should: °Be screened for bone loss. °Take a calcium or vitamin D supplement to lower your risk of fractures. °Be given hormone replacement therapy (HRT) to treat symptoms of menopause. °Follow these instructions at home: °Alcohol use °Do not drink alcohol if: °Your health care provider tells you not to drink. °You are pregnant, may be pregnant, or are planning to become pregnant. °If you drink alcohol: °Limit how much you have to: °0-1 drink a day. °Know how much alcohol is in your drink. In the [place] one drink equals one 12 oz bottle of beer (355 mL), one 5 oz glass of wine (148 mL), or one 1½ oz glass of hard liquor (44 mL). °Lifestyle °Do not use any products that contain nicotine or tobacco. These products include cigarettes, chewing tobacco, and vaping devices, such as e-cigarettes. If you need help quitting, ask your health care provider. °Do not use street drugs. °Do not share needles. °Ask your health care provider for help if you need support or information about quitting drugs. °General instructions °Schedule regular health, dental, and eye exams. °Stay current with your vaccines. °Tell your health care provider if: °You often  feel depressed. °You have ever been abused or do not feel safe at home. °Summary °Adopting a healthy lifestyle and getting preventive care are important in promoting health and wellness. °Follow your health care provider's instructions about healthy diet, exercising, and getting tested or screened for diseases. °Follow your health care provider's instructions on monitoring your cholesterol and blood pressure. °This information is not intended to replace advice given to you by your health care provider. Make sure you discuss any questions you have with your health care provider. °Document Revised: 12/08/2020 Document Reviewed: 12/08/2020 °[employer]vier Patient Education © 2022 [employer] ° °

## 2021-06-19 ENCOUNTER — Other Ambulatory Visit: Payer: Self-pay | Admitting: Family Medicine

## 2021-06-19 MED ORDER — LEVOTHYROXINE SODIUM 100 MCG PO TABS: ORAL_TABLET | ORAL | 4 refills | Status: AC

## 2021-07-30 LAB — HM MAMMOGRAPHY

## 2021-11-27 ENCOUNTER — Other Ambulatory Visit: Payer: Self-pay

## 2021-11-27 MED ORDER — PRAVASTATIN SODIUM 20 MG PO TABS: 20.0000 mg | ORAL_TABLET | Freq: Every day | ORAL | 0 refills | Status: AC
